# Patient Record
Sex: Female | Born: 1995 | Race: White | Hispanic: No | Marital: Single | Smoking: Never smoker
Health system: Southern US, Community
[De-identification: ages and names within clinical notes are randomized; demographics above are authoritative.]

## PROBLEM LIST (undated history)

## (undated) DIAGNOSIS — F419 Anxiety disorder, unspecified: Secondary | ICD-10-CM

## (undated) DIAGNOSIS — T7840XA Allergy, unspecified, initial encounter: Secondary | ICD-10-CM

## (undated) DIAGNOSIS — G43909 Migraine, unspecified, not intractable, without status migrainosus: Secondary | ICD-10-CM

## (undated) DIAGNOSIS — F32A Depression, unspecified: Secondary | ICD-10-CM

## (undated) HISTORY — DX: Anxiety disorder, unspecified: F41.9

## (undated) HISTORY — DX: Depression, unspecified: F32.A

## (undated) HISTORY — DX: Allergy, unspecified, initial encounter: T78.40XA

## (undated) HISTORY — PX: KIDNEY STONE SURGERY: SHX686

---
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
## 2016-01-07 DIAGNOSIS — N132 Hydronephrosis with renal and ureteral calculous obstruction: Secondary | ICD-10-CM | POA: Diagnosis not present

## 2016-01-07 DIAGNOSIS — R109 Unspecified abdominal pain: Secondary | ICD-10-CM | POA: Diagnosis not present

## 2016-01-07 DIAGNOSIS — N201 Calculus of ureter: Secondary | ICD-10-CM | POA: Diagnosis not present

## 2016-01-07 DIAGNOSIS — N200 Calculus of kidney: Secondary | ICD-10-CM | POA: Diagnosis not present

## 2016-01-08 DIAGNOSIS — N200 Calculus of kidney: Secondary | ICD-10-CM | POA: Diagnosis not present

## 2016-01-08 DIAGNOSIS — N201 Calculus of ureter: Secondary | ICD-10-CM | POA: Diagnosis not present

## 2016-01-15 DIAGNOSIS — N201 Calculus of ureter: Secondary | ICD-10-CM | POA: Diagnosis not present

## 2016-01-15 DIAGNOSIS — N200 Calculus of kidney: Secondary | ICD-10-CM | POA: Diagnosis not present

## 2016-02-25 DIAGNOSIS — N200 Calculus of kidney: Secondary | ICD-10-CM | POA: Diagnosis not present

## 2016-02-25 DIAGNOSIS — N201 Calculus of ureter: Secondary | ICD-10-CM | POA: Diagnosis not present

## 2016-03-17 DIAGNOSIS — F418 Other specified anxiety disorders: Secondary | ICD-10-CM | POA: Diagnosis not present

## 2016-03-17 DIAGNOSIS — F324 Major depressive disorder, single episode, in partial remission: Secondary | ICD-10-CM | POA: Diagnosis not present

## 2016-07-06 DIAGNOSIS — R51 Headache: Secondary | ICD-10-CM | POA: Diagnosis not present

## 2016-07-06 DIAGNOSIS — N920 Excessive and frequent menstruation with regular cycle: Secondary | ICD-10-CM | POA: Diagnosis not present

## 2016-09-05 DIAGNOSIS — Z113 Encounter for screening for infections with a predominantly sexual mode of transmission: Secondary | ICD-10-CM | POA: Diagnosis not present

## 2016-09-05 DIAGNOSIS — N921 Excessive and frequent menstruation with irregular cycle: Secondary | ICD-10-CM | POA: Diagnosis not present

## 2016-09-22 DIAGNOSIS — Z3046 Encounter for surveillance of implantable subdermal contraceptive: Secondary | ICD-10-CM | POA: Diagnosis not present

## 2016-10-15 DIAGNOSIS — R52 Pain, unspecified: Secondary | ICD-10-CM | POA: Diagnosis not present

## 2016-10-15 DIAGNOSIS — J029 Acute pharyngitis, unspecified: Secondary | ICD-10-CM | POA: Diagnosis not present

## 2016-11-29 DIAGNOSIS — N390 Urinary tract infection, site not specified: Secondary | ICD-10-CM | POA: Diagnosis not present

## 2017-01-01 DIAGNOSIS — N1330 Unspecified hydronephrosis: Secondary | ICD-10-CM | POA: Diagnosis not present

## 2017-01-01 DIAGNOSIS — R112 Nausea with vomiting, unspecified: Secondary | ICD-10-CM | POA: Diagnosis not present

## 2017-01-01 DIAGNOSIS — N132 Hydronephrosis with renal and ureteral calculous obstruction: Secondary | ICD-10-CM | POA: Diagnosis not present

## 2017-01-01 DIAGNOSIS — R1110 Vomiting, unspecified: Secondary | ICD-10-CM | POA: Diagnosis not present

## 2017-01-01 DIAGNOSIS — N201 Calculus of ureter: Secondary | ICD-10-CM | POA: Diagnosis not present

## 2017-01-01 DIAGNOSIS — Z79899 Other long term (current) drug therapy: Secondary | ICD-10-CM | POA: Diagnosis not present

## 2017-01-01 DIAGNOSIS — N136 Pyonephrosis: Secondary | ICD-10-CM | POA: Diagnosis not present

## 2017-01-01 DIAGNOSIS — R109 Unspecified abdominal pain: Secondary | ICD-10-CM | POA: Diagnosis not present

## 2017-01-01 DIAGNOSIS — K2970 Gastritis, unspecified, without bleeding: Secondary | ICD-10-CM | POA: Diagnosis not present

## 2017-01-01 DIAGNOSIS — R110 Nausea: Secondary | ICD-10-CM | POA: Diagnosis not present

## 2017-01-03 DIAGNOSIS — N200 Calculus of kidney: Secondary | ICD-10-CM | POA: Diagnosis not present

## 2017-01-03 DIAGNOSIS — N201 Calculus of ureter: Secondary | ICD-10-CM | POA: Diagnosis not present

## 2017-01-05 DIAGNOSIS — F419 Anxiety disorder, unspecified: Secondary | ICD-10-CM | POA: Diagnosis not present

## 2017-01-05 DIAGNOSIS — N132 Hydronephrosis with renal and ureteral calculous obstruction: Secondary | ICD-10-CM | POA: Diagnosis not present

## 2017-01-05 DIAGNOSIS — Z79899 Other long term (current) drug therapy: Secondary | ICD-10-CM | POA: Diagnosis not present

## 2017-01-05 DIAGNOSIS — N201 Calculus of ureter: Secondary | ICD-10-CM | POA: Diagnosis not present

## 2017-01-12 DIAGNOSIS — N201 Calculus of ureter: Secondary | ICD-10-CM | POA: Diagnosis not present

## 2017-04-17 DIAGNOSIS — Z79899 Other long term (current) drug therapy: Secondary | ICD-10-CM | POA: Diagnosis not present

## 2017-04-17 DIAGNOSIS — S161XXA Strain of muscle, fascia and tendon at neck level, initial encounter: Secondary | ICD-10-CM | POA: Diagnosis not present

## 2017-04-17 DIAGNOSIS — M542 Cervicalgia: Secondary | ICD-10-CM | POA: Diagnosis not present

## 2017-04-17 DIAGNOSIS — S060X0A Concussion without loss of consciousness, initial encounter: Secondary | ICD-10-CM | POA: Diagnosis not present

## 2017-04-17 DIAGNOSIS — R110 Nausea: Secondary | ICD-10-CM | POA: Diagnosis not present

## 2017-04-17 DIAGNOSIS — Z7951 Long term (current) use of inhaled steroids: Secondary | ICD-10-CM | POA: Diagnosis not present

## 2017-04-17 DIAGNOSIS — R51 Headache: Secondary | ICD-10-CM | POA: Diagnosis not present

## 2017-04-19 DIAGNOSIS — M25512 Pain in left shoulder: Secondary | ICD-10-CM | POA: Diagnosis not present

## 2017-04-19 DIAGNOSIS — S4992XA Unspecified injury of left shoulder and upper arm, initial encounter: Secondary | ICD-10-CM | POA: Diagnosis not present

## 2017-04-19 DIAGNOSIS — Z87820 Personal history of traumatic brain injury: Secondary | ICD-10-CM | POA: Diagnosis not present

## 2017-04-24 DIAGNOSIS — L0291 Cutaneous abscess, unspecified: Secondary | ICD-10-CM | POA: Diagnosis not present

## 2017-04-24 DIAGNOSIS — S060X9A Concussion with loss of consciousness of unspecified duration, initial encounter: Secondary | ICD-10-CM | POA: Diagnosis not present

## 2017-05-01 DIAGNOSIS — M25512 Pain in left shoulder: Secondary | ICD-10-CM | POA: Diagnosis not present

## 2017-05-04 DIAGNOSIS — S060X0A Concussion without loss of consciousness, initial encounter: Secondary | ICD-10-CM | POA: Diagnosis not present

## 2017-05-17 ENCOUNTER — Encounter: Payer: Self-pay | Admitting: Family Medicine

## 2017-05-17 ENCOUNTER — Ambulatory Visit (INDEPENDENT_AMBULATORY_CARE_PROVIDER_SITE_OTHER): Payer: BLUE CROSS/BLUE SHIELD | Admitting: Family Medicine

## 2017-05-17 VITALS — BP 137/80 | HR 80 | Ht 64.0 in | Wt 225.0 lb

## 2017-05-17 DIAGNOSIS — S060X0A Concussion without loss of consciousness, initial encounter: Secondary | ICD-10-CM

## 2017-05-17 MED ORDER — PROPRANOLOL HCL 40 MG PO TABS: 40.0000 mg | ORAL_TABLET | Freq: Three times a day (TID) | ORAL | 1 refills | Status: DC

## 2017-05-17 NOTE — Patient Instructions (Addendum)
You have a concussion. Start propranolol three times a day to help prevent headaches. You can start by taking half a tablet three times a day for a few days to make sure you tolerate this ok. Ok to take tylenol 500mg  1-2 tabs three times a day as needed for headache. We will order oculomotor therapy for you (often this is accompanied by vestibular therapy) in [place]. Take ibuprofen or aleve only if the pain from headache is severe and not helped by the tylenol. If you need a letter from me for anything give me a call. We can consider cognitive therapy, vestibular therapy, nortriptyline if not improving as expected. Follow up with me in 4 weeks but call me sooner if you have any questions.

## 2017-05-18 DIAGNOSIS — F419 Anxiety disorder, unspecified: Secondary | ICD-10-CM | POA: Insufficient documentation

## 2017-05-18 DIAGNOSIS — S060X0A Concussion without loss of consciousness, initial encounter: Secondary | ICD-10-CM | POA: Insufficient documentation

## 2017-05-18 DIAGNOSIS — Z8659 Personal history of other mental and behavioral disorders: Secondary | ICD-10-CM | POA: Insufficient documentation

## 2017-05-18 DIAGNOSIS — F329 Major depressive disorder, single episode, unspecified: Secondary | ICD-10-CM | POA: Insufficient documentation

## 2017-05-18 DIAGNOSIS — F32A Depression, unspecified: Secondary | ICD-10-CM | POA: Insufficient documentation

## 2017-05-18 NOTE — Assessment & Plan Note (Signed)
CT head negative.  Patient's first concussion.  Exam overall reassuring.  Symptoms predominantly headache, cognitive, and oculomotor.  We discussed medications and different types of therapy.  Will start propranolol.  Tylenol as needed.  Start oculomotor therapy.  F/u in 4 weeks for reevaluation.

## 2017-05-18 NOTE — Progress Notes (Signed)
PCP: System, Pcp Not In  Subjective:   HPI: Patient is a 21 y.o. female here for concussion.  Patient reports on 7/30 she was at a gas station when she slipped and struck her head on a post next to the pump. Was in shock initially but when she arrived home she developed a worsening headache, blurry vision. She went to ED and had normal head CT on 7/31. Denies loss of consciousness. This is her first concussion. Was out of work following this. She is taking fish oil, multivitamin, melatonin. Stopped her toradol given her concussion. Does not play sports. Has history of anxiety and migraines. Denies nausea, dizziness, mood changes. Most prominent symptoms are difficulty remembering, concentrating, headaches, photophobia, phonophobia, sleep problems. SCAT 3 symptoms 15/22, score 49/132.  No past medical history on file.  No current outpatient prescriptions on file prior to visit.   No current facility-administered medications on file prior to visit.     No past surgical history on file.  No Known Allergies  Social History   Social History  . Marital status: Single    Spouse name: N/A  . Number of children: N/A  . Years of education: N/A   Occupational History  . Not on file.   Social History Main Topics  . Smoking status: Never Smoker  . Smokeless tobacco: Never Used  . Alcohol use Not on file  . Drug use: Unknown  . Sexual activity: Not on file   Other Topics Concern  . Not on file   Social History Narrative  . No narrative on file    No family history on file.  BP 137/80   Pulse 80   Ht 5\' 4"  (1.626 m)   Wt 225 lb (102.1 kg)   BMI 38.62 kg/m   Review of Systems: See HPI above.     Objective:  Physical Exam:  Gen: NAD, comfortable in exam room  Neuro: CN 2-12 grossly intact. Alert, orientation 5/5 Immediate memory 14/15 Concentration 3/5 Neck FROM without pain. Balance 0 errors with single leg, double leg, tandem stance. Coordination  1/1 Delayed recall 4/5 Strength 5/5 all extremities. Sensation intact to light touch all extremities. MSRs 2+ and equal in biceps, triceps, brachioradialis, patellar, achilles tendons. Vertical and horizontal saccades could only do ~12 repetitions   Assessment & Plan:  1. Concussion without loss of consciousness - CT head negative.  Patient's first concussion.  Exam overall reassuring.  Symptoms predominantly headache, cognitive, and oculomotor.  We discussed medications and different types of therapy.  Will start propranolol.  Tylenol as needed.  Start oculomotor therapy.  F/u in 4 weeks for reevaluation.

## 2017-05-24 ENCOUNTER — Ambulatory Visit: Payer: BLUE CROSS/BLUE SHIELD | Attending: Family Medicine | Admitting: Physical Therapy

## 2017-05-24 DIAGNOSIS — R42 Dizziness and giddiness: Secondary | ICD-10-CM | POA: Diagnosis not present

## 2017-05-24 NOTE — Therapy (Signed)
[facility] [address]  [address] [place], [place], [address] Phone: [phone]   Fax:  [phone]  Physical Therapy Evaluation  Patient Details  Name: Katrina Knight MRN: [id number] Date of Birth: 17-Oct-1995 Referring Provider: Dr. Norton Blizzard  Encounter Date: 05/24/2017      PT End of Session - 05/24/17 1727    Visit Number 1   Number of Visits 6   Date for PT Re-Evaluation 07/05/17   PT Start Time 1403   PT Stop Time 1437   PT Time Calculation (min) 34 min   Activity Tolerance Patient tolerated treatment well   Behavior During Therapy [facility] for tasks assessed/performed      No past medical history on file.  No past surgical history on file.  There were no vitals filed for this visit.       Subjective Assessment - 05/24/17 1406    Subjective Was at a gas station - puddle of fuel - slipped and fell and hit head. No LOC, went to ED that night - had a headache and blurry vision. Had CT scan - clear. DX with concussion. Does continue to have headaches, as well as trouble with looking at screens and reading focusing on workd. When looking up from reading has "dots" in vision. Has a headahce currently - improves with rest, worse with loud noises and bright lights. Not driving at night.    Currently in Pain? Yes   Pain Score 4    Pain Location Head  temporal region - B   Pain Descriptors / Indicators Aching;Dull  searing pain with concentration   Pain Type Acute pain   Pain Onset More than a month ago   Pain Frequency Intermittent   Aggravating Factors  bright lights, computer screens   Pain Relieving Factors rest            [facility] PT Assessment - 05/24/17 1410      Assessment   Medical Diagnosis Concussion without LOC   Referring Provider Dr. Norton Blizzard   Onset Date/Surgical Date --  July 2018   Next MD Visit 06/14/17   Prior Therapy no     Precautions   Precautions None     Restrictions   Weight  Bearing Restrictions No     Balance Screen   Has the patient fallen in the past 6 months Yes   How many times? 1   Has the patient had a decrease in activity level because of a fear of falling?  No   Is the patient reluctant to leave their home because of a fear of falling?  No     Home Environment   Living Environment Private residence   Type of Home Apartment   Home Access Level entry   Additional Comments feels like depth perception is off     Prior Function   Level of Independence Independent   Vocation Student     Cognition   Overall Cognitive Status Within Functional Limits for tasks assessed     Observation/Other Assessments   Focus on Therapeutic Outcomes (FOTO)  Vertigo: 78 (22% limited, predicted 9% limited)     ROM / Strength   AROM / PROM / Strength AROM     AROM   Overall AROM Comments Full and symmetrical all planes - some muscular :tightness" noted with B sidebending   AROM Assessment Site Cervical            Vestibular Assessment - 05/24/17  0001      Vestibular Assessment   General Observation wearing ballcap to block lighting     Symptom Behavior   Type of Dizziness Blurred vision   Frequency of Dizziness 1-2 times a day   Duration of Dizziness 1-3 minutes   Aggravating Factors --  looking at computer screen, reading   Relieving Factors Rest     Occulomotor Exam   Occulomotor Alignment Normal   Spontaneous Absent   Gaze-induced Absent   Smooth Pursuits Intact  reports loss of focus   Saccades Slow;Poor trajectory   Comment is wearing contacts     Auditory   Comments reports ringing in B ears (alternating - typically L)        Objective measurements completed on examination: See above findings.           Vestibular Treatment/Exercise - 05/24/17 0001      Vestibular Treatment/Exercise   Vestibular Treatment Provided Gaze  Edwyna ShellHart Chart   Gaze Exercises X1 Viewing Horizontal;X1 Viewing Vertical     X1 Viewing Horizontal    Foot Position --  seated   Time --  30 sec   Reps 2     X1 Viewing Vertical   Foot Position --  seated   Time --  30 sec   Reps 2               PT Education - 05/24/17 1727    Education provided Yes   Education Details exam findings, POC, HEP   Person(s) Educated Patient   Methods Explanation;Demonstration;Handout   Comprehension Verbalized understanding;Returned demonstration             PT Long Term Goals - 05/24/17 1735      PT LONG TERM GOAL #1   Title patient to be independent with HEP   Status New   Target Date 07/05/17     PT LONG TERM GOAL #2   Title patient to report reduction in symptoms by >/= 50% for greater than 2 weeks   Status New   Target Date 07/05/17     PT LONG TERM GOAL #3   Title patient to report no difficulty looking at computer or phone screen for prolonged times   Status New   Target Date 07/05/17     PT LONG TERM GOAL #4   Title Patient to demonstrate ability to perform all smooth pursuits and saccades with no over/under shoot as well as no loss of focus.    Status New   Target Date 07/05/17                Plan - 05/24/17 1728    Clinical Impression Statement Katrina LyonsCasey is a 21 y/o female presenting to OPPT today following concussion due to fall and striking her head. Patient reporting continued heaches, difficulty with bright lights, reading, as well as difficulty with focusing. Patient is a Consulting civil engineerstudent at [employer], with current symptoms interfering with studies. Patient today with no noted nystagmus, however, does demonstrate slow and poor trajectory with saccades. Some balance deficits also noted with dififuclty with SLS as well as Romberg with EC.  Initial HEP given today for horizontal and vertical fixation exercises as well as [facility] with good carryover. Patient to benefit from PT to allow for improved symptoms for improved QOL.    Clinical Presentation Stable   Clinical Decision Making Low   Rehab Potential Good   PT  Frequency 1x / week   PT Duration 6 weeks  PT Treatment/Interventions ADLs/Self Care Home Management;Canalith Repostioning;Cryotherapy;Electrical Stimulation;Moist Heat;Neuromuscular re-education;Therapeutic exercise;Therapeutic activities;Patient/family education;Manual techniques;Passive range of motion;Visual/perceptual remediation/compensation;Vestibular;Vasopneumatic Device;Dry needling   Consulted and Agree with Plan of Care Patient      Patient will benefit from skilled therapeutic intervention in order to improve the following deficits and impairments:  Impaired vision/preception, Dizziness  Visit Diagnosis: Dizziness and giddiness - Plan: PT plan of care cert/re-cert     Problem List Patient Active Problem List   Diagnosis Date Noted  . Anxiety 05/18/2017  . Depression 05/18/2017  . History of panic attacks 05/18/2017  . Concussion without loss of consciousness 05/18/2017  . Left nephrolithiasis 01/07/2016     Kipp Laurence, PT, DPT 05/24/17 5:39 PM   [facility] [address]  [address] [place], [place], [address] Phone: [phone]   Fax:  [phone]  Name: Katrina Knight MRN: [id number] Date of Birth: Jan 29, 1996

## 2017-05-24 NOTE — Patient Instructions (Signed)
Gaze Stabilization: Sitting   Keeping eyes on target on wall \\_10N  feet away, tilt head down 15-30 and move head side to side for __30__ seconds. Repeat while moving head up and down for __30__ seconds. Do _3-5___ sessions per day.  NECK TENSION: Assisted Stretch   Reach right arm around head and hold slightly above ear. Gently bring right ear toward right shoulder. Hold position for _30__ breaths. Repeat with other arm. Repeat _3__ times, alternating arms. Do _2__ times per day.  Levator Scapula Stretch, Sitting   Sit, one hand tucked under hip on side to be stretched, other hand over top of head. Turn head toward other side and look down. Use hand on head to gently stretch neck in that position. Hold _30__ seconds. Repeat __2_ times per session.

## 2017-05-31 ENCOUNTER — Ambulatory Visit: Payer: BLUE CROSS/BLUE SHIELD | Admitting: Physical Therapy

## 2017-05-31 ENCOUNTER — Encounter: Payer: BLUE CROSS/BLUE SHIELD | Admitting: Physical Therapy

## 2017-06-07 ENCOUNTER — Ambulatory Visit: Payer: BLUE CROSS/BLUE SHIELD | Admitting: Physical Therapy

## 2017-06-07 ENCOUNTER — Encounter: Payer: BLUE CROSS/BLUE SHIELD | Admitting: Physical Therapy

## 2017-06-14 ENCOUNTER — Ambulatory Visit (INDEPENDENT_AMBULATORY_CARE_PROVIDER_SITE_OTHER): Payer: BLUE CROSS/BLUE SHIELD | Admitting: Family Medicine

## 2017-06-14 ENCOUNTER — Encounter: Payer: BLUE CROSS/BLUE SHIELD | Admitting: Physical Therapy

## 2017-06-14 ENCOUNTER — Encounter: Payer: Self-pay | Admitting: Family Medicine

## 2017-06-14 DIAGNOSIS — S060X0D Concussion without loss of consciousness, subsequent encounter: Secondary | ICD-10-CM

## 2017-06-14 NOTE — Patient Instructions (Signed)
You're doing great! Stop the propranolol now. Call me in 1-2 weeks to let me know how you're doing off of this but I don't think you'll have any issues. Go to therapy next week - this will likely be your last visit there. Continue your home exercises through to that therapy visit.

## 2017-06-16 NOTE — Assessment & Plan Note (Signed)
CT head negative, her first concussion.  Exam reassuring and clinically improving.  Has upcoming therapy appointment on 10/3 - may be her last appointment given how well she's doing.  Stop propranolol now.  Tylenol if needed.  Call us in 1-2 weeks after stopping propranolol for an update on her status.  Continue home exercises through to PT appointment.    Total visit time 25 minutes - > 50% of which spent on counseling.

## 2017-06-16 NOTE — Progress Notes (Signed)
PCP: System, Pcp Not In  Subjective:   HPI: Patient is a 21 y.o. female here for concussion.  8/29: Patient reports on 7/30 she was at a gas station when she slipped and struck her head on a post next to the pump. Was in shock initially but when she arrived home she developed a worsening headache, blurry vision. She went to ED and had normal head CT on 7/31. Denies loss of consciousness. This is her first concussion. Was out of work following this. She is taking fish oil, multivitamin, melatonin. Stopped her toradol given her concussion. Does not play sports. Has history of anxiety and migraines. Denies nausea, dizziness, mood changes. Most prominent symptoms are difficulty remembering, concentrating, headaches, photophobia, phonophobia, sleep problems. SCAT 3 symptoms 15/22, score 49/132.  9/26: Patient is doing well overall. Last headache was about a week ago. Doing well with school. Not having to take breaks. Last headache was 1 week ago. Sleeping better and concentration improved. Able to walk around campus without issues. Taking propranolol as directed. Has had one visit of therapy to date and is doing home exercises for neck and oculomotor exercises. SCAT3 6/22, symptom score 10/132.  No past medical history on file.  Current Outpatient Prescriptions on File Prior to Visit  Medication Sig Dispense Refill  . propranolol (INDERAL) 40 MG tablet Take 1 tablet (40 mg total) by mouth 3 (three) times daily. 90 tablet 1  . SPRINTEC 28 0.25-35 MG-MCG tablet Take 1 tablet by mouth daily.  3  . SUMAtriptan (IMITREX) 25 MG tablet 1 TABLET BY MOUTH 1 TO 2 TIMES A DAY AS NEEDED FOR MIGRAINE HEADACHE,MAY REPEAT DOSE ONCE IN 2 HOURS  1   No current facility-administered medications on file prior to visit.     No past surgical history on file.  No Known Allergies  Social History   Social History  . Marital status: Single    Spouse name: N/A  . Number of children: N/A  .  Years of education: N/A   Occupational History  . Not on file.   Social History Main Topics  . Smoking status: Never Smoker  . Smokeless tobacco: Never Used  . Alcohol use Not on file  . Drug use: Unknown  . Sexual activity: Not on file   Other Topics Concern  . Not on file   Social History Narrative  . No narrative on file    No family history on file.  BP 109/72   Pulse 81   Ht  (1.626 m)   Wt 225 lb (102.1 kg)   BMI 38.62 kg/m   Review of Systems: See HPI above.     Objective:  Physical Exam:  Gen: NAD, comfortable in exam room.  Neuro: Alert, orientation 5/5 Immediate memory 15/15 Concentration 5/5 Neck FROM without pain Balance 0 errors double leg, tandem stance.  1 error single leg. Coordination 1/1 Delayed recall 5/5 Vertical and horizontal saccades 30 reps without symptoms.   Assessment & Plan:  1. Concussion without loss of consciousness - CT head negative, her first concussion.  Exam reassuring and clinically improving.  Has upcoming therapy appointment on 10/3 - may be her last appointment given how well she's doing.  Stop propranolol now.  Tylenol if needed.  Call us in 1-2 weeks after stopping propranolol for an update on her status.  Continue home exercises through to PT appointment.    Total visit time 25 minutes - > 50% of which spent on counseling.

## 2017-06-21 ENCOUNTER — Encounter: Payer: BLUE CROSS/BLUE SHIELD | Admitting: Physical Therapy

## 2017-06-21 ENCOUNTER — Ambulatory Visit: Payer: BLUE CROSS/BLUE SHIELD | Attending: Family Medicine | Admitting: Physical Therapy

## 2017-06-21 DIAGNOSIS — R42 Dizziness and giddiness: Secondary | ICD-10-CM | POA: Diagnosis not present

## 2017-06-21 NOTE — Therapy (Signed)
[facility] [place] [address]  [place], [place], [address] Phone: [phone]   Fax:  [phone]  Physical Therapy Treatment  Patient Details  Name: Katrina Knight MRN: [id number] Date of Birth: [birth year] Referring Provider: Dr. Karlton Lemon  Encounter Date: 06/21/2017      PT End of Session - 06/21/17 1420    Visit Number 2   Number of Visits 6   Date for PT Re-Evaluation 07/05/17   PT Start Time 1400   PT Stop Time 1414   PT Time Calculation (min) 14 min   Activity Tolerance Patient tolerated treatment well   Behavior During Therapy [employer] for tasks assessed/performed      No past medical history on file.  No past surgical history on file.  There were no vitals filed for this visit.      Subjective Assessment - 06/21/17 1402    Subjective Good improvement in symptoms; no more falls; able to look at screen for longer   Currently in Pain? No/denies   Pain Score 0-No pain                Vestibular Assessment - 06/21/17 0001      Symptom Behavior   Type of Dizziness Blurred vision  only with prolonged screen time   Frequency of Dizziness none in past week   Duration of Dizziness none     Occulomotor Exam   Occulomotor Alignment Normal   Spontaneous Absent   Gaze-induced Absent   Smooth Pursuits Intact   Saccades Intact     Auditory   Comments ocassional ringing in ears                  Vestibular Treatment/Exercise - 06/21/17 0001      Vestibular Treatment/Exercise   Vestibular Treatment Provided Gaze   Gaze Exercises X2 Viewing Horizontal;X2 Viewing Vertical     X2 Viewing Horizontal   Foot Position --  seated   Time --  30 sec   Reps 2     X2 Viewing Vertical   Foot Position --  seated   Time --  30 sec   Reps 2                    PT Long Term Goals - 06/21/17 1420      PT LONG TERM GOAL #1   Title patient to be independent with HEP   Status  Achieved     PT LONG TERM GOAL #2   Title patient to report reduction in symptoms by >/= 50% for greater than 2 weeks   Status Achieved     PT LONG TERM GOAL #3   Title patient to report no difficulty looking at computer or phone screen for prolonged times   Status Achieved     PT LONG TERM GOAL #4   Title Patient to demonstrate ability to perform all smooth pursuits and saccades with no over/under shoot as well as no loss of focus.    Status Achieved               Plan - 06/21/17 1421    Clinical Impression Statement Adaleen reporting near full resolution in symptoms for approx 1-2 weeks. Reports MD ahs released her from his care as well as instructed patient to discontinue use of propranolol with no last side effects since discontinuing. Patient today with good convergence, saccades, and smooth pursuit with no issues with trajectory or increase  in symtpoms. Did review HEP as well as progress to X2 viewing with good carryover. Will plan to d/c today.    PT Treatment/Interventions ADLs/Self Care Home Management;Canalith Repostioning;Cryotherapy;Electrical Stimulation;Moist Heat;Neuromuscular re-education;Therapeutic exercise;Therapeutic activities;Patient/family education;Manual techniques;Passive range of motion;Visual/perceptual remediation/compensation;Vestibular;Vasopneumatic Device;Dry needling   Consulted and Agree with Plan of Care Patient      Patient will benefit from skilled therapeutic intervention in order to improve the following deficits and impairments:  Impaired vision/preception, Dizziness  Visit Diagnosis: Dizziness and giddiness     Problem List Patient Active Problem List   Diagnosis Date Noted  . Anxiety 05/18/2017  . Depression 05/18/2017  . History of panic attacks 05/18/2017  . Concussion without loss of consciousness 05/18/2017  . Left nephrolithiasis 01/07/2016   Katrina Knight, PT, DPT 06/21/17 2:42 PM   PHYSICAL THERAPY DISCHARGE  SUMMARY  Visits from Start of Care: 2  Current functional level related to goals / functional outcomes: See above   Remaining deficits: See above   Education / Equipment: HEP  Plan: Patient agrees to discharge.  Patient goals were met. Patient is being discharged due to meeting the stated rehab goals.  ?????    Katrina Knight, PT, DPT 06/21/17 2:43 PM  [facility] [address]  [place], [place], [address] Phone: [phone]   Fax:  [phone]  Name: Katrina Knight MRN: [id number] Date of Birth: 1996-03-13

## 2017-07-10 ENCOUNTER — Telehealth: Payer: Self-pay | Admitting: Family Medicine

## 2017-07-10 NOTE — Telephone Encounter (Signed)
Has she had any problems since stopping the propranolol? (i.e. Any headaches, dizziness, concentration problems).  If not we can provide a letter.  I don't think I can email it though, even through mychart.

## 2017-07-10 NOTE — Telephone Encounter (Signed)
Patient states she has not had any problems since stopping the propranolol.  She said she can pick up the letter in the office.

## 2017-07-10 NOTE — Telephone Encounter (Signed)
Patient called requesting a written statement that she has been cleared from her concussion.   Asked if it could be e-mailed?

## 2017-07-10 NOTE — Telephone Encounter (Signed)
Letter printed.

## 2017-10-28 DIAGNOSIS — N3091 Cystitis, unspecified with hematuria: Secondary | ICD-10-CM | POA: Diagnosis not present

## 2017-12-07 ENCOUNTER — Encounter: Payer: Self-pay | Admitting: Family Medicine

## 2018-03-14 DIAGNOSIS — F4323 Adjustment disorder with mixed anxiety and depressed mood: Secondary | ICD-10-CM | POA: Diagnosis not present

## 2018-03-27 DIAGNOSIS — F4323 Adjustment disorder with mixed anxiety and depressed mood: Secondary | ICD-10-CM | POA: Diagnosis not present

## 2018-08-21 DIAGNOSIS — F4323 Adjustment disorder with mixed anxiety and depressed mood: Secondary | ICD-10-CM | POA: Diagnosis not present

## 2018-08-28 DIAGNOSIS — F4323 Adjustment disorder with mixed anxiety and depressed mood: Secondary | ICD-10-CM | POA: Diagnosis not present

## 2018-09-04 DIAGNOSIS — F4323 Adjustment disorder with mixed anxiety and depressed mood: Secondary | ICD-10-CM | POA: Diagnosis not present

## 2018-09-18 DIAGNOSIS — F4323 Adjustment disorder with mixed anxiety and depressed mood: Secondary | ICD-10-CM | POA: Diagnosis not present

## 2018-09-25 DIAGNOSIS — F4323 Adjustment disorder with mixed anxiety and depressed mood: Secondary | ICD-10-CM | POA: Diagnosis not present

## 2018-10-02 DIAGNOSIS — F4323 Adjustment disorder with mixed anxiety and depressed mood: Secondary | ICD-10-CM | POA: Diagnosis not present

## 2018-10-12 DIAGNOSIS — J069 Acute upper respiratory infection, unspecified: Secondary | ICD-10-CM | POA: Diagnosis not present

## 2018-10-12 DIAGNOSIS — B9789 Other viral agents as the cause of diseases classified elsewhere: Secondary | ICD-10-CM | POA: Diagnosis not present

## 2018-10-12 DIAGNOSIS — R6889 Other general symptoms and signs: Secondary | ICD-10-CM | POA: Diagnosis not present

## 2018-10-16 DIAGNOSIS — R509 Fever, unspecified: Secondary | ICD-10-CM | POA: Diagnosis not present

## 2018-10-19 DIAGNOSIS — F4323 Adjustment disorder with mixed anxiety and depressed mood: Secondary | ICD-10-CM | POA: Diagnosis not present

## 2018-10-23 DIAGNOSIS — F4323 Adjustment disorder with mixed anxiety and depressed mood: Secondary | ICD-10-CM | POA: Diagnosis not present

## 2018-10-30 DIAGNOSIS — F4323 Adjustment disorder with mixed anxiety and depressed mood: Secondary | ICD-10-CM | POA: Diagnosis not present

## 2018-11-06 DIAGNOSIS — F4323 Adjustment disorder with mixed anxiety and depressed mood: Secondary | ICD-10-CM | POA: Diagnosis not present

## 2018-11-13 DIAGNOSIS — F4323 Adjustment disorder with mixed anxiety and depressed mood: Secondary | ICD-10-CM | POA: Diagnosis not present

## 2018-11-20 DIAGNOSIS — F4323 Adjustment disorder with mixed anxiety and depressed mood: Secondary | ICD-10-CM | POA: Diagnosis not present

## 2018-12-04 DIAGNOSIS — F4323 Adjustment disorder with mixed anxiety and depressed mood: Secondary | ICD-10-CM | POA: Diagnosis not present

## 2018-12-11 DIAGNOSIS — F4323 Adjustment disorder with mixed anxiety and depressed mood: Secondary | ICD-10-CM | POA: Diagnosis not present

## 2018-12-18 DIAGNOSIS — F4323 Adjustment disorder with mixed anxiety and depressed mood: Secondary | ICD-10-CM | POA: Diagnosis not present

## 2018-12-25 DIAGNOSIS — F4323 Adjustment disorder with mixed anxiety and depressed mood: Secondary | ICD-10-CM | POA: Diagnosis not present

## 2019-01-01 DIAGNOSIS — F4323 Adjustment disorder with mixed anxiety and depressed mood: Secondary | ICD-10-CM | POA: Diagnosis not present

## 2019-01-08 DIAGNOSIS — F4323 Adjustment disorder with mixed anxiety and depressed mood: Secondary | ICD-10-CM | POA: Diagnosis not present

## 2019-01-15 DIAGNOSIS — F4323 Adjustment disorder with mixed anxiety and depressed mood: Secondary | ICD-10-CM | POA: Diagnosis not present

## 2019-01-22 DIAGNOSIS — F4323 Adjustment disorder with mixed anxiety and depressed mood: Secondary | ICD-10-CM | POA: Diagnosis not present

## 2019-01-29 DIAGNOSIS — F4323 Adjustment disorder with mixed anxiety and depressed mood: Secondary | ICD-10-CM | POA: Diagnosis not present

## 2019-02-05 DIAGNOSIS — F4323 Adjustment disorder with mixed anxiety and depressed mood: Secondary | ICD-10-CM | POA: Diagnosis not present

## 2019-02-12 DIAGNOSIS — F4323 Adjustment disorder with mixed anxiety and depressed mood: Secondary | ICD-10-CM | POA: Diagnosis not present

## 2019-02-19 DIAGNOSIS — F4323 Adjustment disorder with mixed anxiety and depressed mood: Secondary | ICD-10-CM | POA: Diagnosis not present

## 2019-02-26 DIAGNOSIS — F4323 Adjustment disorder with mixed anxiety and depressed mood: Secondary | ICD-10-CM | POA: Diagnosis not present

## 2019-03-05 DIAGNOSIS — F4323 Adjustment disorder with mixed anxiety and depressed mood: Secondary | ICD-10-CM | POA: Diagnosis not present

## 2019-03-19 DIAGNOSIS — F4323 Adjustment disorder with mixed anxiety and depressed mood: Secondary | ICD-10-CM | POA: Diagnosis not present

## 2019-03-26 DIAGNOSIS — F4323 Adjustment disorder with mixed anxiety and depressed mood: Secondary | ICD-10-CM | POA: Diagnosis not present

## 2019-06-06 DIAGNOSIS — R070 Pain in throat: Secondary | ICD-10-CM | POA: Diagnosis not present

## 2019-06-06 DIAGNOSIS — H6692 Otitis media, unspecified, left ear: Secondary | ICD-10-CM | POA: Diagnosis not present

## 2019-06-06 DIAGNOSIS — Z20828 Contact with and (suspected) exposure to other viral communicable diseases: Secondary | ICD-10-CM | POA: Diagnosis not present

## 2019-06-26 DIAGNOSIS — Z118 Encounter for screening for other infectious and parasitic diseases: Secondary | ICD-10-CM | POA: Diagnosis not present

## 2019-06-26 DIAGNOSIS — Z01419 Encounter for gynecological examination (general) (routine) without abnormal findings: Secondary | ICD-10-CM | POA: Diagnosis not present

## 2019-06-26 DIAGNOSIS — Z3009 Encounter for other general counseling and advice on contraception: Secondary | ICD-10-CM | POA: Diagnosis not present

## 2019-06-26 DIAGNOSIS — Z6841 Body Mass Index (BMI) 40.0 and over, adult: Secondary | ICD-10-CM | POA: Diagnosis not present

## 2019-07-15 DIAGNOSIS — R519 Headache, unspecified: Secondary | ICD-10-CM | POA: Diagnosis not present

## 2019-07-15 DIAGNOSIS — G43819 Other migraine, intractable, without status migrainosus: Secondary | ICD-10-CM | POA: Diagnosis not present

## 2019-09-23 DIAGNOSIS — R0981 Nasal congestion: Secondary | ICD-10-CM | POA: Diagnosis not present

## 2019-09-23 DIAGNOSIS — R5383 Other fatigue: Secondary | ICD-10-CM | POA: Diagnosis not present

## 2019-09-23 DIAGNOSIS — Z20828 Contact with and (suspected) exposure to other viral communicable diseases: Secondary | ICD-10-CM | POA: Diagnosis not present

## 2019-10-02 DIAGNOSIS — J019 Acute sinusitis, unspecified: Secondary | ICD-10-CM | POA: Diagnosis not present

## 2019-10-02 DIAGNOSIS — J029 Acute pharyngitis, unspecified: Secondary | ICD-10-CM | POA: Diagnosis not present

## 2019-10-14 DIAGNOSIS — Z30011 Encounter for initial prescription of contraceptive pills: Secondary | ICD-10-CM | POA: Diagnosis not present

## 2020-05-15 DIAGNOSIS — F4323 Adjustment disorder with mixed anxiety and depressed mood: Secondary | ICD-10-CM | POA: Diagnosis not present

## 2020-06-19 ENCOUNTER — Ambulatory Visit: Payer: BLUE CROSS/BLUE SHIELD | Admitting: Nurse Practitioner

## 2020-06-24 ENCOUNTER — Ambulatory Visit: Payer: BLUE CROSS/BLUE SHIELD | Admitting: Nurse Practitioner

## 2020-07-16 ENCOUNTER — Other Ambulatory Visit: Payer: Self-pay

## 2020-07-17 ENCOUNTER — Ambulatory Visit: Payer: BC Managed Care – PPO | Admitting: Nurse Practitioner

## 2020-07-17 ENCOUNTER — Encounter: Payer: Self-pay | Admitting: Nurse Practitioner

## 2020-07-17 VITALS — BP 126/84 | HR 82 | Temp 97.0°F | Wt 244.0 lb

## 2020-07-17 DIAGNOSIS — G43719 Chronic migraine without aura, intractable, without status migrainosus: Secondary | ICD-10-CM

## 2020-07-17 DIAGNOSIS — Z23 Encounter for immunization: Secondary | ICD-10-CM

## 2020-07-17 MED ORDER — TOPIRAMATE 25 MG PO TABS: ORAL_TABLET | ORAL | 5 refills | Status: DC

## 2020-07-17 NOTE — Patient Instructions (Addendum)
Topamax effects the effectiveness of your contraception. Use condoms in combination with oral hormonal contraception to prevent pregnancy. Decrease caffeine intake  Recurrent Migraine Headache  Migraines are a type of headache, and they are usually stronger and more sudden than normal headaches (tension headaches). Migraines are characterized by an intense pulsing, throbbing pain that is usually only present on one side of the head. Sometimes, migraine headaches can cause nausea, vomiting, sensitivity to light and sound, and vision changes. Recurrent migraines keep coming back (recurring). A migraine can last from 4 hours up to 3 days. What are the causes? The exact cause of this condition is not known. However, a migraine may be caused when nerves in the brain become irritated and release chemicals that cause inflammation of blood vessels. This inflammation causes pain. Certain things may also trigger migraines, such as:  A disruption in your regular eating and sleeping schedule.  Smoking.  Stress.  Menstruation.  Certain foods and drinks, such as: ? Aged cheese. ? Chocolate. ? Alcohol. ? Caffeine. ? Foods or drinks that contain nitrates, glutamate, aspartame, MSG, or tyramine.  Lack of sleep.  Hunger.  Physical exertion.  Fatigue.  High altitude.  Weather changes.  Medicines, such as: ? Nitroglycerin, which is used to treat chest pain. ? Birth control pills. ? Estrogen. ? Some blood pressure medicines. What are the signs or symptoms? Symptoms of this condition vary for each person and may include:  Pain that is usually only present on one side of the head. In some cases, the pain may be on both sides of the head or around the head or neck.  Pulsating or throbbing pain.  Severe pain that prevents daily activities.  Pain that is aggravated by any physical activity.  Nausea, vomiting, or both.  Dizziness.  Pain with exposure to bright lights, loud noises, or  activity.  General sensitivity to bright lights, loud noises, or smells. Before you get a migraine, you may get warning signs that a migraine is coming (aura). An aura may include:  Seeing flashing lights.  Seeing bright spots, halos, or zigzag lines.  Having tunnel vision or blurred vision.  Having numbness or a tingling feeling.  Having trouble talking.  Having muscle weakness.  Smelling a certain odor. How is this diagnosed? This condition is often diagnosed based on:  Your symptoms and medical history.  A physical exam. You may also have tests, including:  A CT scan or MRI of your brain. These imaging tests cannot diagnose migraines, but they can help to rule out other causes of headaches.  Blood tests. How is this treated? This condition is treated with:  Medicines. These are used for: ? Lessening pain and nausea. ? Preventing recurrent migraines.  Lifestyle changes, such as changes to your diet or sleeping patterns.  Behavior therapy, such as relaxation training or biofeedback. Biofeedback is a treatment that involves teaching you to relax and use your brain to lower your heart rate and control your breathing. Follow these instructions at home: Medicines  Take over-the-counter and prescription medicines only as told by your health care provider.  Do not drive or use heavy machinery while taking prescription pain medicine. Lifestyle  Do not use any products that contain nicotine or tobacco, such as cigarettes and e-cigarettes. If you need help quitting, ask your health care provider.  Limit alcohol intake to no more than 1 drink a day for nonpregnant women and 2 drinks a day for men. One drink equals 12 oz of beer,  5 oz of wine, or 1 oz of hard liquor.  Get 7-9 hours of sleep each night, or the amount of sleep recommended by your health care provider.  Limit your stress. Talk with your health care provider if you need help with stress management.  Maintain  a healthy weight. If you need help losing weight, ask your health care provider.  Exercise regularly. Aim for 150 minutes of moderate-intensity exercise (walking, biking, yoga) or 75 minutes of vigorous exercise (running, circuit training, swimming) each week. General instructions   Keep a journal to find out what triggers your migraine headaches so you can avoid these triggers. For example, write down: ? What you eat and drink. ? How much sleep you get. ? Any change to your diet or medicines.  Lie down in a dark, quiet room when you have a migraine.  Try placing a cool towel over your head when you have a migraine.  Keep lights dim, if bright lights bother you and make your migraines worse.  Keep all follow-up visits as told by your health care provider. This is important. Contact a health care provider if:  Your pain does not improve, even with medicine.  Your migraines continue to return, even with medicine.  You have a fever.  You have weight loss. Get help right away if:  Your migraine becomes severe and medicine does not help.  You have a stiff neck.  You have a loss of vision.  You have muscle weakness or loss of muscle control.  You start losing your balance or have trouble walking.  You feel faint or you pass out.  You develop new, severe symptoms.  You start having abrupt severe headaches that last for a second or less, like a thunderclap. Summary  Migraine headaches are usually stronger and more sudden than normal headaches (tension headaches). Migraines are characterized by an intense pulsing, throbbing pain that is usually only present on one side of the head.  The exact cause of this condition is not known. However, a migraine may be caused when nerves in the brain become irritated and release chemicals that cause inflammation of blood vessels.  Certain things may trigger migraines, such as changes to diet or sleeping patterns, smoking, certain foods,  alcohol, stress, and certain medicines.  Sometimes, migraine headaches can cause nausea, vomiting, sensitivity to light and sound, and vision changes.  Migraines are often diagnosed based on your symptoms, medical history, and a physical exam. This information is not intended to replace advice given to you by your health care provider. Make sure you discuss any questions you have with your health care provider. Document Revised: 09/08/2017 Document Reviewed: 06/17/2016 Elsevier Patient Education  2020 [employer].

## 2020-07-17 NOTE — Progress Notes (Signed)
Subjective:  Patient ID: Katrina Knight, female    DOB: [birth year]  Age: 24 y.o. MRN: [id number]  CC: Establish Care (New patient/Migraines for several years but more frequently in the past 2 years. )  Migraine  This is a chronic problem. The current episode started more than 1 year ago (age 4). The problem occurs constantly. The problem has been gradually worsening. The pain is located in the frontal and retro-orbital region. The pain does not radiate. The pain quality is similar to prior headaches. The quality of the pain is described as dull and squeezing. The pain is moderate. Associated symptoms include eye pain, photophobia and sinus pressure. Pertinent negatives include no abdominal pain, abnormal behavior, blurred vision, dizziness, eye redness, eye watering, fever, hearing loss, insomnia, loss of balance, muscle aches, nausea, numbness, phonophobia, rhinorrhea, tingling, tinnitus or visual change. The symptoms are aggravated by emotional stress, bright light, caffeine withdrawal and weather changes. She has tried triptans and Excedrin for the symptoms. The treatment provided moderate relief. Her past medical history is significant for migraine headaches, migraines in the family and obesity. There is no history of cancer, cluster headaches, hypertension, immunosuppression, pseudotumor cerebri, recent head traumas, sinus disease or TMJ.  concussion in 2018, hit head on metal pole at a gas station. CT Head done 2018 post head trauma (hit head on metal pole): normal Wakes up with pressure in frontal portion everyday. Use of blue light glass, use of Excedrin 2x/month, unable to tolerate imitrex ("makes me feel funny but helped with headache").  Reviewed past Medical, Social and Family history today.  Outpatient Medications Prior to Visit  Medication Sig Dispense Refill  . AUROVELA FE 1.5/30 1.5-30 MG-MCG tablet Take 1 tablet by mouth daily.    . propranolol (INDERAL) 40 MG tablet Take 1 tablet  (40 mg total) by mouth 3 (three) times daily. 90 tablet 1  . SPRINTEC 28 0.25-35 MG-MCG tablet Take 1 tablet by mouth daily.  3  . SUMAtriptan (IMITREX) 25 MG tablet 1 TABLET BY MOUTH 1 TO 2 TIMES A DAY AS NEEDED FOR MIGRAINE HEADACHE,MAY REPEAT DOSE ONCE IN 2 HOURS  1   No facility-administered medications prior to visit.    ROS See HPI  Objective:  BP 126/84 (BP Location: Left Arm, Patient Position: Sitting, Cuff Size: Large)   Pulse 82   Temp (!) 97 F (36.1 C) (Temporal)   Wt 244 lb (110.7 kg)   SpO2 98%   BMI 41.88 kg/m   Physical Exam Vitals reviewed.  Constitutional:      Appearance: She is obese.  Cardiovascular:     Rate and Rhythm: Normal rate and regular rhythm.     Pulses: Normal pulses.     Heart sounds: Normal heart sounds.  Pulmonary:     Effort: Pulmonary effort is normal.     Breath sounds: Normal breath sounds.  Musculoskeletal:     Cervical back: Normal range of motion and neck supple.  Lymphadenopathy:     Cervical: No cervical adenopathy.  Neurological:     Mental Status: She is alert and oriented to person, place, and time.  Psychiatric:        Mood and Affect: Mood normal.        Behavior: Behavior normal.        Thought Content: Thought content normal.    Assessment & Plan:  This visit occurred during the SARS-CoV-2 public health emergency.  Safety protocols were in place, including screening questions prior to the visit,  additional usage of staff PPE, and extensive cleaning of exam room while observing appropriate contact time as indicated for disinfecting solutions.   Katrina Knight was seen today for establish care.  Diagnoses and all orders for this visit:  Intractable chronic migraine without aura and without status migrainosus -     topiramate (TOPAMAX) 25 MG tablet; Take 1tab daily x 2week, then 1tab BID continuously  Influenza vaccine needed -     Flu Vaccine QUAD 6+ mos PF IM (Fluarix Quad PF)   Problem List Items Addressed This Visit        Cardiovascular and Mediastinum   Intractable chronic migraine without aura and without status migrainosus - Primary    Chronic, onset at age 47, worsening inn last 66yrs.  Triggers: stress, changes in weather, bright light. Consumes 20oz of coffee daily tobacco, occasional ETOH intake. No hx of seizure. FHx of migraine (mother) Current use of OCP, not sexually active. concussion in 2018, hit head on metal pole at a gas station. CT Head done 2018 post head trauma (hit head on metal pole): normal Wakes up with pressure in frontal portion everyday. Has migraine headache 2-3x/month (some improvement with Excedrin and bluelight glasses), unable to tolerate imitrex ("makes me feel funny but helped with headache"). She does not remember taking propanolol that was prescribed in 2018.  Start topamax: titrate up to 25mg  BID. F/up in 5month. Advised about possible side effects: Topamax effects the effectiveness of your contraception. Use condoms in combination with oral hormonal contraception to prevent pregnancy Also advised to Decrease caffeine intake.      Relevant Medications   topiramate (TOPAMAX) 25 MG tablet    Other Visit Diagnoses    Influenza vaccine needed       Relevant Orders   Flu Vaccine QUAD 6+ mos PF IM (Fluarix Quad PF) (Completed)      Follow-up: Return in about 4 weeks (around 08/14/2020) for CPE (fasting).  08/16/2020, NP

## 2020-07-17 NOTE — Assessment & Plan Note (Signed)
Chronic, onset at age 24, worsening inn last 65yrs.  Triggers: stress, changes in weather, bright light. Consumes 20oz of coffee daily tobacco, occasional ETOH intake. No hx of seizure. FHx of migraine (mother) Current use of OCP, not sexually active. concussion in 2018, hit head on metal pole at a gas station. CT Head done 2018 post head trauma (hit head on metal pole): normal Wakes up with pressure in frontal portion everyday. Has migraine headache 2-3x/month (some improvement with Excedrin and bluelight glasses), unable to tolerate imitrex ("makes me feel funny but helped with headache"). She does not remember taking propanolol that was prescribed in 2018.  Start topamax: titrate up to 25mg  BID. F/up in 37month. Advised about possible side effects: Topamax effects the effectiveness of your contraception. Use condoms in combination with oral hormonal contraception to prevent pregnancy Also advised to Decrease caffeine intake.

## 2020-08-08 DIAGNOSIS — Z1152 Encounter for screening for COVID-19: Secondary | ICD-10-CM | POA: Diagnosis not present

## 2020-08-28 ENCOUNTER — Encounter: Payer: BC Managed Care – PPO | Admitting: Nurse Practitioner

## 2020-09-04 DIAGNOSIS — Z20822 Contact with and (suspected) exposure to covid-19: Secondary | ICD-10-CM | POA: Diagnosis not present

## 2020-10-01 ENCOUNTER — Other Ambulatory Visit: Payer: Self-pay

## 2020-10-02 ENCOUNTER — Encounter: Payer: Self-pay | Admitting: Nurse Practitioner

## 2020-10-02 ENCOUNTER — Telehealth (INDEPENDENT_AMBULATORY_CARE_PROVIDER_SITE_OTHER): Payer: BC Managed Care – PPO | Admitting: Nurse Practitioner

## 2020-10-02 VITALS — Ht 64.0 in | Wt 244.0 lb

## 2020-10-02 DIAGNOSIS — J209 Acute bronchitis, unspecified: Secondary | ICD-10-CM

## 2020-10-02 MED ORDER — AMOXICILLIN-POT CLAVULANATE 875-125 MG PO TABS: 1.0000 | ORAL_TABLET | Freq: Two times a day (BID) | ORAL | 0 refills | Status: DC

## 2020-10-02 MED ORDER — GUAIFENESIN-DM 100-10 MG/5ML PO SYRP: 5.0000 mL | ORAL_SOLUTION | ORAL | 0 refills | Status: DC | PRN

## 2020-10-02 MED ORDER — CETIRIZINE HCL 10 MG PO TABS: 10.0000 mg | ORAL_TABLET | Freq: Every day | ORAL | 0 refills | Status: DC

## 2020-10-02 NOTE — Progress Notes (Signed)
Virtual Visit via Video Note  I connected with@ on 10/02/20 at 10:15 AM EST by a video enabled telemedicine application and verified that I am speaking with the correct person using two identifiers.  Location: Patient:Home Provider: Office Participants: patient and provider  I discussed the limitations of evaluation and management by telemedicine and the availability of in person appointments. I also discussed with the patient that there may be a patient responsible charge related to this service. The patient expressed understanding and agreed to proceed.  CC:Pt c/o cough and states there feels like something needs to come up with the cough but it doesn't x 10 or more days.   History of Present Illness: Cough This is a new problem. The current episode started 1 to 4 weeks ago. The problem has been unchanged. The problem occurs constantly. The cough is non-productive. Associated symptoms include nasal congestion and postnasal drip. Pertinent negatives include no chest pain, chills, fever, rhinorrhea, sore throat, shortness of breath, sweats, weight loss or wheezing. The symptoms are aggravated by lying down. She has tried nothing for the symptoms. There is no history of asthma, bronchitis, COPD or environmental allergies.   Observations/Objective: Physical Exam Pulmonary:     Effort: Pulmonary effort is normal.  Neurological:     Mental Status: She is alert and oriented to person, place, and time.    Assessment and Plan: Katrina Knight was seen today for acute visit.  Diagnoses and all orders for this visit:  Acute bronchitis, unspecified organism -     amoxicillin-clavulanate (AUGMENTIN) 875-125 MG tablet; Take 1 tablet by mouth 2 (two) times daily. -     guaiFENesin-dextromethorphan (ROBITUSSIN DM) 100-10 MG/5ML syrup; Take 5 mLs by mouth every 4 (four) hours as needed for cough. -     cetirizine (ZYRTEC) 10 MG tablet; Take 1 tablet (10 mg total) by mouth daily.   Follow Up  Instructions: See above   I discussed the assessment and treatment plan with the patient. The patient was provided an opportunity to ask questions and all were answered. The patient agreed with the plan and demonstrated an understanding of the instructions.   The patient was advised to call back or seek an in-person evaluation if the symptoms worsen or if the condition fails to improve as anticipated.   Alysia Penna, NP

## 2020-10-16 ENCOUNTER — Telehealth: Payer: Self-pay | Admitting: Nurse Practitioner

## 2020-10-16 NOTE — Telephone Encounter (Signed)
Patient sent MyChart message requesting to schedule Annual Physical. Called and left message to give the office a call back.

## 2020-11-28 DIAGNOSIS — H8110 Benign paroxysmal vertigo, unspecified ear: Secondary | ICD-10-CM | POA: Diagnosis not present

## 2020-11-28 DIAGNOSIS — H6500 Acute serous otitis media, unspecified ear: Secondary | ICD-10-CM | POA: Diagnosis not present

## 2020-11-28 DIAGNOSIS — R1110 Vomiting, unspecified: Secondary | ICD-10-CM | POA: Diagnosis not present

## 2020-12-09 DIAGNOSIS — Z01419 Encounter for gynecological examination (general) (routine) without abnormal findings: Secondary | ICD-10-CM | POA: Diagnosis not present

## 2020-12-09 DIAGNOSIS — Z6839 Body mass index (BMI) 39.0-39.9, adult: Secondary | ICD-10-CM | POA: Diagnosis not present

## 2020-12-09 DIAGNOSIS — Z118 Encounter for screening for other infectious and parasitic diseases: Secondary | ICD-10-CM | POA: Diagnosis not present

## 2021-01-13 ENCOUNTER — Other Ambulatory Visit: Payer: Self-pay

## 2021-01-14 ENCOUNTER — Ambulatory Visit (INDEPENDENT_AMBULATORY_CARE_PROVIDER_SITE_OTHER): Payer: BC Managed Care – PPO | Admitting: Nurse Practitioner

## 2021-01-14 ENCOUNTER — Encounter: Payer: Self-pay | Admitting: Nurse Practitioner

## 2021-01-14 VITALS — BP 124/74 | HR 88 | Temp 97.5°F | Ht 64.0 in | Wt 233.2 lb

## 2021-01-14 DIAGNOSIS — Z13220 Encounter for screening for lipoid disorders: Secondary | ICD-10-CM | POA: Diagnosis not present

## 2021-01-14 DIAGNOSIS — Z0000 Encounter for general adult medical examination without abnormal findings: Secondary | ICD-10-CM | POA: Diagnosis not present

## 2021-01-14 DIAGNOSIS — Z136 Encounter for screening for cardiovascular disorders: Secondary | ICD-10-CM

## 2021-01-14 DIAGNOSIS — G43719 Chronic migraine without aura, intractable, without status migrainosus: Secondary | ICD-10-CM | POA: Diagnosis not present

## 2021-01-14 MED ORDER — TOPIRAMATE 25 MG PO TABS: 25.0000 mg | ORAL_TABLET | Freq: Two times a day (BID) | ORAL | 3 refills | Status: DC

## 2021-01-14 NOTE — Progress Notes (Signed)
Subjective:    Patient ID: Katrina Knight, female    DOB: September 21, 1995, 25 y.o.   MRN: [id number]  Patient presents today for CPE   HPI Intractable chronic migraine without aura and without status migrainosus Improved with use of topamax BID and decrease caffeine intake. No need for abortive therapy Use of COC and condoms for contraception Med refill sent  Sexual History (orientation,birth control, marital status, STD):use of COC, no need for STD screen Will schedule appt with GYN for repeat breast and pelvic exam  Depression/Suicide: will schedule appt with therapist Depression screen [facility] 2/9 01/14/2021  Decreased Interest 0  Down, Depressed, Hopeless 0  PHQ - 2 Score 0  Altered sleeping 1  Tired, decreased energy 1  Change in appetite 0  Feeling bad or failure about yourself  0  Trouble concentrating 1  Moving slowly or fidgety/restless 0  Suicidal thoughts 0  PHQ-9 Score 3  Difficult doing work/chores Not difficult at all   GAD 7 : Generalized Anxiety Score 01/14/2021  Nervous, Anxious, on Edge 1  Control/stop worrying 1  Worry too much - different things 0  Trouble relaxing 0  Restless 0  Easily annoyed or irritable 1  Afraid - awful might happen 0  Total GAD 7 Score 3  Anxiety Difficulty Somewhat difficult   Vision:up to date, use of contact lens  Dental:will schedule  Immunizations: (TDAP, Hep C screen, Pneumovax, Influenza, zoster)  Health Maintenance  Topic Date Due  . Pap Smear  Never done  . Pap Smear  Never done  .  Hepatitis C: One time screening is recommended by Center for Disease Control  (CDC) for  adults born from [birth year] through 1964.   01/14/2022*  . HIV Screening  01/14/2022*  . Flu Shot  04/19/2021  . Tetanus Vaccine  02/23/2028  . HPV Vaccine  Completed  . COVID-19 Vaccine  Completed  *Topic was postponed. The date shown is not the original due date.   Diet:regular Exercise: none Weight:  Wt Readings from Last 3 Encounters:  01/14/21 233  lb 3.2 oz (105.8 kg)  10/02/20 244 lb (110.7 kg)  07/17/20 244 lb (110.7 kg)   Fall Risk: Fall Risk  01/14/2021 10/02/2020  Falls in the past year? 0 0  Number falls in past yr: 0 0  Injury with Fall? 0 0  Risk for fall due to : No Fall Risks No Fall Risks  Follow up Falls evaluation completed Falls evaluation completed    Medications and allergies reviewed with patient and updated if appropriate.  Patient Active Problem List   Diagnosis Date Noted  . Intractable chronic migraine without aura and without status migrainosus 07/17/2020  . Anxiety 05/18/2017  . Depression 05/18/2017  . History of panic attacks 05/18/2017  . Concussion without loss of consciousness 05/18/2017  . Left nephrolithiasis 01/07/2016    Current Outpatient Medications on File Prior to Visit  Medication Sig Dispense Refill  . meclizine (ANTIVERT) 25 MG tablet Take 25 mg by mouth 3 (three) times daily as needed for dizziness.    . ondansetron (ZOFRAN) 8 MG tablet Take by mouth every 8 (eight) hours as needed for nausea or vomiting.    Rosalia Hammers FE 1.5/30 1.5-30 MG-MCG tablet Take 1 tablet by mouth daily.    . cetirizine (ZYRTEC) 10 MG tablet Take 1 tablet (10 mg total) by mouth daily. 7 tablet 0   No current facility-administered medications on file prior to visit.    Past Medical History:  Diagnosis Date  . Anxiety   . Depression     Past Surgical History:  Procedure Laterality Date  . KIDNEY STONE SURGERY     2017 and 2018    Social History   Socioeconomic History  . Marital status: Single    Spouse name: Not on file  . Number of children: 0  . Years of education: Not on file  . Highest education level: Not on file  Occupational History  . Not on file  Tobacco Use  . Smoking status: Never Smoker  . Smokeless tobacco: Never Used  Vaping Use  . Vaping Use: Never used  Substance and Sexual Activity  . Alcohol use: Yes    Comment: Occasionally  . Drug use: Never  . Sexual activity: Yes     Birth control/protection: Pill  Other Topics Concern  . Not on file  Social History Narrative  . Not on file   Social Determinants of Health   Financial Resource Strain: Not on file  Food Insecurity: Not on file  Transportation Needs: Not on file  Physical Activity: Not on file  Stress: Not on file  Social Connections: Not on file    Family History  Problem Relation Age of Onset  . Cancer Mother 9       Breast cancer, negative BRCA gene  . Hypertension Mother   . Migraines Mother   . Sleep apnea Mother         Review of Systems  Constitutional: Negative for fever, malaise/fatigue and weight loss.  HENT: Negative for congestion and sore throat.   Eyes:       Negative for visual changes  Respiratory: Negative for cough and shortness of breath.   Cardiovascular: Negative for chest pain, palpitations and leg swelling.  Gastrointestinal: Negative for blood in stool, constipation, diarrhea and heartburn.  Genitourinary: Negative for dysuria, frequency and urgency.  Musculoskeletal: Negative for falls, joint pain and myalgias.  Skin: Negative for rash.  Neurological: Negative for dizziness, sensory change and headaches.  Endo/Heme/Allergies: Does not bruise/bleed easily.  Psychiatric/Behavioral: Negative for depression, hallucinations, substance abuse and suicidal ideas. The patient is nervous/anxious. The patient does not have insomnia.     Objective:   Vitals:   01/14/21 1002  BP: 124/74  Pulse: 88  Temp: (!) 97.5 F (36.4 C)  SpO2: 98%    Body mass index is 40.03 kg/m.   Physical Examination:  Physical Exam Vitals and nursing note reviewed.  Constitutional:      General: She is not in acute distress.    Appearance: She is well-developed. She is obese.  HENT:     Right Ear: Ear canal and external ear normal. No drainage or tenderness. A middle ear effusion is present. There is no impacted cerumen. Tympanic membrane is not injected, scarred, perforated  or erythematous.     Left Ear: Ear canal and external ear normal. No drainage. A middle ear effusion is present. There is no impacted cerumen. Tympanic membrane is not injected, scarred, perforated or erythematous.     Mouth/Throat:     Pharynx: No oropharyngeal exudate.  Eyes:     Conjunctiva/sclera: Conjunctivae normal.     Pupils: Pupils are equal, round, and reactive to light.  Cardiovascular:     Rate and Rhythm: Normal rate and regular rhythm.     Pulses: Normal pulses.     Heart sounds: Normal heart sounds.  Pulmonary:     Effort: Pulmonary effort is normal. No respiratory distress.  Breath sounds: Normal breath sounds.  Chest:     Chest wall: No tenderness.  Abdominal:     General: Bowel sounds are normal.     Palpations: Abdomen is soft.  Genitourinary:    Comments: Deferred breast and pelvic exam to GYN Musculoskeletal:        General: Normal range of motion.     Cervical back: Normal range of motion and neck supple.     Right lower leg: No edema.     Left lower leg: No edema.  Skin:    General: Skin is warm and dry.     Findings: Lesion present.       Neurological:     Mental Status: She is alert and oriented to person, place, and time.     Deep Tendon Reflexes: Reflexes are normal and symmetric.  Psychiatric:        Mood and Affect: Mood normal.        Behavior: Behavior normal.        Thought Content: Thought content normal.    ASSESSMENT and PLAN: This visit occurred during the SARS-CoV-2 public health emergency.  Safety protocols were in place, including screening questions prior to the visit, additional usage of staff PPE, and extensive cleaning of exam room while observing appropriate contact time as indicated for disinfecting solutions.   Neelam was seen today for annual exam.  Diagnoses and all orders for this visit:  Preventative health care -     CBC; Future -     Comprehensive metabolic panel -     Lipid panel; Future -     TSH;  Future  Intractable chronic migraine without aura and without status migrainosus -     topiramate (TOPAMAX) 25 MG tablet; Take 1 tablet (25 mg total) by mouth 2 (two) times daily.  Encounter for lipid screening for cardiovascular disease -     Lipid panel; Future      Problem List Items Addressed This Visit      Cardiovascular and Mediastinum   Intractable chronic migraine without aura and without status migrainosus    Improved with use of topamax BID and decrease caffeine intake. No need for abortive therapy Use of COC and condoms for contraception Med refill sent      Relevant Medications   topiramate (TOPAMAX) 25 MG tablet    Other Visit Diagnoses    Preventative health care    -  Primary   Relevant Orders   CBC   Comprehensive metabolic panel   Lipid panel   TSH   Encounter for lipid screening for cardiovascular disease       Relevant Orders   Lipid panel      Follow up: Return in about 1 year (around 01/14/2022) for CPE (fasting).  Wilfred Lacy, NP

## 2021-01-14 NOTE — Assessment & Plan Note (Addendum)
Improved with use of topamax BID and decrease caffeine intake. No need for abortive therapy Use of COC and condoms for contraception Med refill sent

## 2021-01-14 NOTE — Patient Instructions (Addendum)
Schedule fasting lab appt. You will need to be fasting at least 6-8hrs prior to blood draw Ok to drink water.  Maintain heart healthy diet and daily exercise Schedule appt with dentist every24month Sign medical release form to get records from [id number] Old, Female Preventive care refers to lifestyle choices and visits with your health care provider that can promote health and wellness. This includes:  A yearly physical exam. This is also called an annual wellness visit.  Regular dental and eye exams.  Immunizations.  Screening for certain conditions.  Healthy lifestyle choices, such as: ? Eating a healthy diet. ? Getting regular exercise. ? Not using drugs or products that contain nicotine and tobacco. ? Limiting alcohol use. What can I expect for my preventive care visit? Physical exam Your health care provider may check your:  Height and weight. These may be used to calculate your BMI (body mass index). BMI is a measurement that tells if you are at a healthy weight.  Heart rate and blood pressure.  Body temperature.  Skin for abnormal spots. Counseling Your health care provider may ask you questions about your:  Past medical problems.  Family's medical history.  Alcohol, tobacco, and drug use.  Emotional well-being.  Home life and relationship well-being.  Sexual activity.  Diet, exercise, and sleep habits.  Work and work eStatistician  Access to firearms.  Method of birth control.  Menstrual cycle.  Pregnancy history. What immunizations do I need? Vaccines are usually given at various ages, according to a schedule. Your health care provider will recommend vaccines for you based on your age, medical history, and lifestyle or other factors, such as travel or where you work.   What tests do I need? Blood tests  Lipid and cholesterol levels. These may be checked every 5 years starting at age 24  Hepatitis C test.  Hepatitis B  test. Screening  Diabetes screening. This is done by checking your blood sugar (glucose) after you have not eaten for a while (fasting).  STD (sexually transmitted disease) testing, if you are at risk.  BRCA-related cancer screening. This may be done if you have a family history of breast, ovarian, tubal, or peritoneal cancers.  Pelvic exam and Pap test. This may be done every 3 years starting at age 24 Starting at age 24 this may be done every 5 years if you have a Pap test in combination with an HPV test. Talk with your health care provider about your test results, treatment options, and if necessary, the need for more tests.   Follow these instructions at home: Eating and drinking  Eat a healthy diet that includes fresh fruits and vegetables, whole grains, lean protein, and low-fat dairy products.  Take vitamin and mineral supplements as recommended by your health care provider.  Do not drink alcohol if: ? Your health care provider tells you not to drink. ? You are pregnant, may be pregnant, or are planning to become pregnant.  If you drink alcohol: ? Limit how much you have to 0-1 drink a day. ? Be aware of how much alcohol is in your drink. In the [place] one drink equals one 12 oz bottle of beer (355 mL), one 5 oz glass of wine (148 mL), or one 1 oz glass of hard liquor (44 mL).   Lifestyle  Take daily care of your teeth and gums. Brush your teeth every morning and night with fluoride toothpaste. Floss one time each day.  Stay  active. Exercise for at least 30 minutes 5 or more days each week.  Do not use any products that contain nicotine or tobacco, such as cigarettes, e-cigarettes, and chewing tobacco. If you need help quitting, ask your health care provider.  Do not use drugs.  If you are sexually active, practice safe sex. Use a condom or other form of protection to prevent STIs (sexually transmitted infections).  If you do not wish to become pregnant, use a form of  birth control. If you plan to become pregnant, see your health care provider for a prepregnancy visit.  Find healthy ways to cope with stress, such as: ? Meditation, yoga, or listening to music. ? Journaling. ? Talking to a trusted person. ? Spending time with friends and family. Safety  Always wear your seat belt while driving or riding in a vehicle.  Do not drive: ? If you have been drinking alcohol. Do not ride with someone who has been drinking. ? When you are tired or distracted. ? While texting.  Wear a helmet and other protective equipment during sports activities.  If you have firearms in your house, make sure you follow all gun safety procedures.  Seek help if you have been physically or sexually abused. What's next?  Go to your health care provider once a year for an annual wellness visit.  Ask your health care provider how often you should have your eyes and teeth checked.  Stay up to date on all vaccines. This information is not intended to replace advice given to you by your health care provider. Make sure you discuss any questions you have with your health care provider. Document Revised: 05/03/2020 Document Reviewed: 05/17/2018 Elsevier Patient Education  2021 [employer].

## 2021-01-15 ENCOUNTER — Other Ambulatory Visit (INDEPENDENT_AMBULATORY_CARE_PROVIDER_SITE_OTHER): Payer: BC Managed Care – PPO

## 2021-01-15 ENCOUNTER — Other Ambulatory Visit: Payer: Self-pay

## 2021-01-15 DIAGNOSIS — Z136 Encounter for screening for cardiovascular disorders: Secondary | ICD-10-CM

## 2021-01-15 DIAGNOSIS — Z0000 Encounter for general adult medical examination without abnormal findings: Secondary | ICD-10-CM | POA: Diagnosis not present

## 2021-01-15 DIAGNOSIS — Z13220 Encounter for screening for lipoid disorders: Secondary | ICD-10-CM | POA: Diagnosis not present

## 2021-01-15 LAB — LIPID PANEL
Cholesterol: 182 mg/dL (ref 0–200)
HDL: 58.3 mg/dL (ref >39.00)
LDL Cholesterol: 110 mg/dL — ABNORMAL HIGH (ref 0–99)
NonHDL: 124.11
Total CHOL/HDL Ratio: 3
Triglycerides: 70 mg/dL (ref 0.0–149.0)
VLDL: 14 mg/dL (ref 0.0–40.0)

## 2021-01-15 LAB — CBC
HCT: 36.3 % (ref 36.0–46.0)
Hemoglobin: 12.2 g/dL (ref 12.0–15.0)
MCHC: 33.7 g/dL (ref 30.0–36.0)
MCV: 94.1 fl (ref 78.0–100.0)
Platelets: 256 10*3/uL (ref 150.0–400.0)
RBC: 3.86 Mil/uL — ABNORMAL LOW (ref 3.87–5.11)
RDW: 14.1 % (ref 11.5–15.5)
WBC: 6 10*3/uL (ref 4.0–10.5)

## 2021-01-15 LAB — TSH: TSH: 1.49 u[IU]/mL (ref 0.35–4.50)

## 2021-01-15 NOTE — Progress Notes (Signed)
Per orders of Katrina Knight pt is here for labs, pt tolerated lab draw well.

## 2021-04-06 ENCOUNTER — Other Ambulatory Visit: Payer: Self-pay | Admitting: Nurse Practitioner

## 2021-04-06 DIAGNOSIS — G43719 Chronic migraine without aura, intractable, without status migrainosus: Secondary | ICD-10-CM

## 2021-04-06 NOTE — Telephone Encounter (Signed)
Chart supports refill Last ov 01/14/2021 Last refill 02/10/2021

## 2021-09-08 DIAGNOSIS — J069 Acute upper respiratory infection, unspecified: Secondary | ICD-10-CM | POA: Diagnosis not present

## 2021-09-10 ENCOUNTER — Encounter: Payer: Self-pay | Admitting: Nurse Practitioner

## 2021-09-10 ENCOUNTER — Other Ambulatory Visit: Payer: Self-pay

## 2021-09-10 ENCOUNTER — Telehealth (INDEPENDENT_AMBULATORY_CARE_PROVIDER_SITE_OTHER): Payer: BC Managed Care – PPO | Admitting: Nurse Practitioner

## 2021-09-10 ENCOUNTER — Other Ambulatory Visit: Payer: Self-pay | Admitting: Nurse Practitioner

## 2021-09-10 DIAGNOSIS — R051 Acute cough: Secondary | ICD-10-CM | POA: Insufficient documentation

## 2021-09-10 DIAGNOSIS — R0982 Postnasal drip: Secondary | ICD-10-CM | POA: Insufficient documentation

## 2021-09-10 DIAGNOSIS — U071 COVID-19: Secondary | ICD-10-CM | POA: Diagnosis not present

## 2021-09-10 MED ORDER — FLUTICASONE PROPIONATE 50 MCG/ACT NA SUSP: 2.0000 | Freq: Every day | NASAL | 0 refills | Status: DC

## 2021-09-10 MED ORDER — BENZONATATE 200 MG PO CAPS: 200.0000 mg | ORAL_CAPSULE | Freq: Three times a day (TID) | ORAL | 0 refills | Status: AC | PRN

## 2021-09-10 NOTE — Assessment & Plan Note (Signed)
Tested positive for covid 19. Look well over video.  Given age and lack of health conditions does not qualify for antiviral treatment.  Did discuss CDC guidelines in regards to quarantining.  Also discussed signs and symptoms when to seek urgent or emergent health care.  We will send in symptomatic medications/treatment

## 2021-09-10 NOTE — Assessment & Plan Note (Signed)
Start Tessalon Perles 200 mg during cough

## 2021-09-10 NOTE — Assessment & Plan Note (Signed)
Start Flonase 2 sprays each nostril 1 day.  Did give precautions in regards to nasal steroid use.

## 2021-09-10 NOTE — Progress Notes (Signed)
Patient ID: Katrina Knight, female    DOB: Jun 01, 1996, 25 y.o.   MRN: [id number]  Virtual visit completed through [employer], a video enabled telemedicine application. Due to national recommendations of social distancing due to COVID-19, a virtual visit is felt to be most appropriate for this patient at this time. Reviewed limitations, risks, security and privacy concerns of performing a virtual visit and the availability of in person appointments. I also reviewed that there may be a patient responsible charge related to this service. The patient agreed to proceed.   Patient location: home Provider location: [place] at [facility], office Persons participating in this virtual visit: patient, provider   If any vitals were documented, they were collected by patient at home unless specified below.    LMP 08/17/2021    CC: Covid 19 Subjective:   HPI: Katrina Knight is a 25 y.o. female presenting on 09/10/2021 for Covid Positive (On 09/09/21, sx started on 09/08/21-fever at first, sore throat, cough, stuffy nose, ears stopped up, fatigue.)  Symptoms on 09/08/2021 Tested for covid on on 09/09/2021. At home test Pfizer x2 and 1 booster Boyfriend also tested positive the same day Dayquill, nyquill, tylenol, ibuprofen, cough drop. Little relief    LMP approx 3 weeks ago  Relevant past medical, surgical, family and social history reviewed and updated as indicated. Interim medical history since our last visit reviewed. Allergies and medications reviewed and updated. Outpatient Medications Prior to Visit  Medication Sig Dispense Refill   AUROVELA FE 1.5/30 1.5-30 MG-MCG tablet Take 1 tablet by mouth daily.     cetirizine (ZYRTEC) 10 MG tablet Take 1 tablet (10 mg total) by mouth daily. 7 tablet 0   meclizine (ANTIVERT) 25 MG tablet Take 25 mg by mouth 3 (three) times daily as needed for dizziness.     ondansetron (ZOFRAN) 8 MG tablet Take by mouth every 8 (eight) hours as needed for nausea or  vomiting.     topiramate (TOPAMAX) 25 MG tablet TAKE 1 TABLET BY MOUTH DAILY FOR 2 WEEKS THEN TAKE 1 TABLET BY MOUTH TWICE DAILY 60 tablet 0   No facility-administered medications prior to visit.     Per HPI unless specifically indicated in ROS section below Review of Systems  Constitutional:  Positive for fatigue. Negative for chills and fever.  HENT:  Positive for congestion, ear pain (full and pressure), postnasal drip and sore throat. Negative for sinus pressure and sinus pain.   Respiratory:  Positive for cough. Negative for shortness of breath.   Cardiovascular:  Negative for chest pain.  Gastrointestinal:  Negative for abdominal pain, diarrhea, nausea and vomiting.  Musculoskeletal:  Positive for myalgias. Negative for arthralgias.  Neurological:  Negative for headaches.  Objective:  LMP 08/17/2021   Wt Readings from Last 3 Encounters:  01/14/21 233 lb 3.2 oz (105.8 kg)  10/02/20 244 lb (110.7 kg)  07/17/20 244 lb (110.7 kg)       Physical exam: Gen: alert, NAD, not ill appearing Pulm: speaks in complete sentences without increased work of breathing Psych: normal mood, normal thought content      Results for orders placed or performed in visit on 01/15/21  TSH  Result Value Ref Range   TSH 1.49 0.35 - 4.50 uIU/mL  Lipid panel  Result Value Ref Range   Cholesterol 182 0 - 200 mg/dL   Triglycerides 71.0 0.0 - 149.0 mg/dL   HDL 62.69 >48.54 mg/dL   VLDL 62.7 0.0 - 03.5 mg/dL   LDL Cholesterol  110 (H) 0 - 99 mg/dL   Total CHOL/HDL Ratio 3    NonHDL 124.11   CBC  Result Value Ref Range   WBC 6.0 4.0 - 10.5 K/uL   RBC 3.86 (L) 3.87 - 5.11 Mil/uL   Platelets 256.0 150.0 - 400.0 K/uL   Hemoglobin 12.2 12.0 - 15.0 g/dL   HCT 13.2 44.0 - 10.2 %   MCV 94.1 78.0 - 100.0 fl   MCHC 33.7 30.0 - 36.0 g/dL   RDW 72.5 36.6 - 44.0 %   Assessment & Plan:   Problem List Items Addressed This Visit       Other   Acute cough    Start Tessalon Perles 200 mg during cough       Relevant Medications   benzonatate (TESSALON) 200 MG capsule   PND (post-nasal drip) - Primary    Start Flonase 2 sprays each nostril 1 day.  Did give precautions in regards to nasal steroid use.      Relevant Medications   fluticasone (FLONASE) 50 MCG/ACT nasal spray   COVID-19    Tested positive for covid 19. Look well over video.  Given age and lack of health conditions does not qualify for antiviral treatment.  Did discuss CDC guidelines in regards to quarantining.  Also discussed signs and symptoms when to seek urgent or emergent health care.  We will send in symptomatic medications/treatment        No orders of the defined types were placed in this encounter.  No orders of the defined types were placed in this encounter.   I discussed the assessment and treatment plan with the patient. The patient was provided an opportunity to ask questions and all were answered. The patient agreed with the plan and demonstrated an understanding of the instructions. The patient was advised to call back or seek an in-person evaluation if the symptoms worsen or if the condition fails to improve as anticipated.  Follow up plan: No follow-ups on file.  Audria Nine, NP

## 2021-10-07 ENCOUNTER — Other Ambulatory Visit: Payer: Self-pay | Admitting: Nurse Practitioner

## 2021-10-07 DIAGNOSIS — R0982 Postnasal drip: Secondary | ICD-10-CM

## 2021-11-12 ENCOUNTER — Ambulatory Visit: Payer: BC Managed Care – PPO | Admitting: Nurse Practitioner

## 2021-11-12 ENCOUNTER — Other Ambulatory Visit: Payer: Self-pay

## 2021-11-12 ENCOUNTER — Encounter: Payer: Self-pay | Admitting: Nurse Practitioner

## 2021-11-12 VITALS — BP 128/78 | HR 111 | Temp 97.4°F | Ht 64.0 in | Wt 242.8 lb

## 2021-11-12 DIAGNOSIS — R1033 Periumbilical pain: Secondary | ICD-10-CM | POA: Diagnosis not present

## 2021-11-12 LAB — COMPREHENSIVE METABOLIC PANEL
ALT: 17 U/L (ref 0–35)
AST: 14 U/L (ref 0–37)
Albumin: 4.2 g/dL (ref 3.5–5.2)
Alkaline Phosphatase: 57 U/L (ref 39–117)
BUN: 10 mg/dL (ref 6–23)
CO2: 28 mEq/L (ref 19–32)
Calcium: 9.1 mg/dL (ref 8.4–10.5)
Chloride: 105 mEq/L (ref 96–112)
Creatinine, Ser: 0.56 mg/dL (ref 0.40–1.20)
GFR: 127.55 mL/min (ref >60.00)
Glucose, Bld: 93 mg/dL (ref 70–99)
Potassium: 4 mEq/L (ref 3.5–5.1)
Sodium: 137 mEq/L (ref 135–145)
Total Bilirubin: 0.6 mg/dL (ref 0.2–1.2)
Total Protein: 7.2 g/dL (ref 6.0–8.3)

## 2021-11-12 LAB — CBC WITH DIFFERENTIAL/PLATELET
Basophils Absolute: 0 10*3/uL (ref 0.0–0.1)
Basophils Relative: 0.5 % (ref 0.0–3.0)
Eosinophils Absolute: 0.1 10*3/uL (ref 0.0–0.7)
Eosinophils Relative: 1.1 % (ref 0.0–5.0)
HCT: 38.1 % (ref 36.0–46.0)
Hemoglobin: 12.8 g/dL (ref 12.0–15.0)
Lymphocytes Relative: 23 % (ref 12.0–46.0)
Lymphs Abs: 1.2 10*3/uL (ref 0.7–4.0)
MCHC: 33.6 g/dL (ref 30.0–36.0)
MCV: 93.6 fl (ref 78.0–100.0)
Monocytes Absolute: 0.6 10*3/uL (ref 0.1–1.0)
Monocytes Relative: 11 % (ref 3.0–12.0)
Neutro Abs: 3.4 10*3/uL (ref 1.4–7.7)
Neutrophils Relative %: 64.4 % (ref 43.0–77.0)
Platelets: 224 10*3/uL (ref 150.0–400.0)
RBC: 4.07 Mil/uL (ref 3.87–5.11)
RDW: 14 % (ref 11.5–15.5)
WBC: 5.3 10*3/uL (ref 4.0–10.5)

## 2021-11-12 LAB — LIPASE: Lipase: 28 U/L (ref 11.0–59.0)

## 2021-11-12 NOTE — Progress Notes (Signed)
Subjective:  Patient ID: Katrina Knight, female    DOB: [birth year]  Age: 26 y.o. MRN: [id number]  CC: Acute Visit (Pt c/o sharp stomach pains x 2 days. Cramp feeling started yesterday evening at 2pm and lasted all day and woke her up in the middle of the night. )  Abdominal Pain This is a new problem. The current episode started yesterday. The onset quality is gradual. The problem occurs constantly. The problem has been waxing and waning. The pain is located in the periumbilical region. The pain is moderate. The quality of the pain is colicky and a sensation of fullness. The abdominal pain does not radiate. Associated symptoms include belching and nausea. Pertinent negatives include no anorexia, arthralgias, constipation, diarrhea, dysuria, fever, flatus, frequency, headaches, hematochezia, hematuria, melena, myalgias, vomiting or weight loss. The pain is aggravated by palpation. The pain is relieved by Nothing. Treatments tried: ibuprofen. There is no history of abdominal surgery, colon cancer, Crohn's disease, irritable bowel syndrome, pancreatitis or PUD.  Denies any GERD symptoms Denies any vaginal symptoms or dyspareunia Home pregnancy test yesterday: negative Takes COC as prescribed  Reviewed past Medical, Social and Family history today.  Outpatient Medications Prior to Visit  Medication Sig Dispense Refill   AUROVELA FE 1.5/30 1.5-30 MG-MCG tablet Take 1 tablet by mouth daily.     fluticasone (FLONASE) 50 MCG/ACT nasal spray Place 2 sprays into both nostrils daily. (Patient not taking: Reported on 11/12/2021) 16 g 0   No facility-administered medications prior to visit.   ROS See HPI  Objective:  BP 128/78 (BP Location: Left Arm, Patient Position: Sitting, Cuff Size: Large)    Pulse (!) 111    Temp (!) 97.4 F (36.3 C) (Temporal)    Ht 5\' 4"  (1.626 m)    Wt 242 lb 12.8 oz (110.1 kg)    SpO2 98%    BMI 41.68 kg/m   Physical Exam Vitals reviewed.  Constitutional:      General:  She is not in acute distress. Cardiovascular:     Rate and Rhythm: Normal rate.     Pulses: Normal pulses.  Pulmonary:     Effort: Pulmonary effort is normal.  Abdominal:     General: Bowel sounds are normal. There is no distension.     Palpations: Abdomen is soft.     Tenderness: There is abdominal tenderness. There is no right CVA tenderness, left CVA tenderness or guarding.  Neurological:     Mental Status: She is alert and oriented to person, place, and time.   Assessment & Plan:  This visit occurred during the SARS-CoV-2 public health emergency.  Safety protocols were in place, including screening questions prior to the visit, additional usage of staff PPE, and extensive cleaning of exam room while observing appropriate contact time as indicated for disinfecting solutions.   [name] was seen today for acute visit.  Diagnoses and all orders for this visit:  Periumbilical abdominal pain -     Cancel: POCT urinalysis dipstick -     CBC with Differential/Platelet -     Lipase -     Comprehensive metabolic panel -     Urinalysis w microscopic + reflex cultur; Future  Unable to provide urine sample today. Will return to lab. Normal CBC, CMP and lipase.  Problem List Items Addressed This Visit   None Visit Diagnoses     Periumbilical abdominal pain    -  Primary   Relevant Orders   CBC with Differential/Platelet (Completed)  Lipase (Completed)   Comprehensive metabolic panel (Completed)   Urinalysis w microscopic + reflex cultur       Follow-up: Return if symptoms worsen or fail to improve.  Wilfred Lacy, NP

## 2021-11-12 NOTE — Patient Instructions (Signed)
Try peptobismol and tylenol as directed on package for pain if needed Maintain BRAT diet.  Go to lab for blood draw and urine collection.  Abdominal Pain, Adult Many things can cause belly (abdominal) pain. Most times, belly pain is not dangerous. Many cases of belly pain can be watched and treated at home. Sometimes, though, belly pain is serious. Your doctor will try to find the cause of your belly pain. Follow these instructions at home: Medicines Take over-the-counter and prescription medicines only as told by your doctor. Do not take medicines that help you poop (laxatives) unless told by your doctor. General instructions Watch your belly pain for any changes. Drink enough fluid to keep your pee (urine) pale yellow. Keep all follow-up visits as told by your doctor. This is important. Contact a doctor if: Your belly pain changes or gets worse. You are not hungry, or you lose weight without trying. You are having trouble pooping (constipated) or have watery poop (diarrhea) for more than 2-3 days. You have pain when you pee or poop. Your belly pain wakes you up at night. Your pain gets worse with meals, after eating, or with certain foods. You are vomiting and cannot keep anything down. You have a fever. You have blood in your pee. Get help right away if: Your pain does not go away as soon as your doctor says it should. You cannot stop vomiting. Your pain is only in areas of your belly, such as the right side or the left lower part of the belly. You have bloody or black poop, or poop that looks like tar. You have very bad pain, cramping, or bloating in your belly. You have signs of not having enough fluid or water in your body (dehydration), such as: Dark pee, very little pee, or no pee. Cracked lips. Dry mouth. Sunken eyes. Sleepiness. Weakness. You have trouble breathing or chest pain. Summary Many cases of belly pain can be watched and treated at home. Watch your belly  pain for any changes. Take over-the-counter and prescription medicines only as told by your doctor. Contact a doctor if your belly pain changes or gets worse. Get help right away if you have very bad pain, cramping, or bloating in your belly. This information is not intended to replace advice given to you by your health care provider. Make sure you discuss any questions you have with your health care provider. Document Revised: 01/14/2019 Document Reviewed: 01/14/2019 Elsevier Patient Education  2022 [employer].

## 2021-11-15 ENCOUNTER — Other Ambulatory Visit: Payer: BC Managed Care – PPO

## 2021-11-15 ENCOUNTER — Other Ambulatory Visit: Payer: Self-pay

## 2021-11-15 DIAGNOSIS — R1033 Periumbilical pain: Secondary | ICD-10-CM | POA: Diagnosis not present

## 2021-11-16 LAB — URINALYSIS W MICROSCOPIC + REFLEX CULTURE
Bacteria, UA: NONE SEEN /HPF
Bilirubin Urine: NEGATIVE
Glucose, UA: NEGATIVE
Hgb urine dipstick: NEGATIVE
Hyaline Cast: NONE SEEN /LPF
Ketones, ur: NEGATIVE
Leukocyte Esterase: NEGATIVE
Nitrites, Initial: NEGATIVE
Protein, ur: NEGATIVE
RBC / HPF: NONE SEEN /HPF (ref 0–2)
Specific Gravity, Urine: 1.016 (ref 1.001–1.035)
WBC, UA: NONE SEEN /HPF (ref 0–5)
pH: 6 (ref 5.0–8.0)

## 2021-11-16 LAB — NO CULTURE INDICATED

## 2021-12-21 DIAGNOSIS — Z118 Encounter for screening for other infectious and parasitic diseases: Secondary | ICD-10-CM | POA: Diagnosis not present

## 2021-12-21 DIAGNOSIS — Z01419 Encounter for gynecological examination (general) (routine) without abnormal findings: Secondary | ICD-10-CM | POA: Diagnosis not present

## 2021-12-21 DIAGNOSIS — Z6841 Body Mass Index (BMI) 40.0 and over, adult: Secondary | ICD-10-CM | POA: Diagnosis not present

## 2021-12-21 DIAGNOSIS — Z3041 Encounter for surveillance of contraceptive pills: Secondary | ICD-10-CM | POA: Diagnosis not present

## 2022-01-18 ENCOUNTER — Ambulatory Visit (INDEPENDENT_AMBULATORY_CARE_PROVIDER_SITE_OTHER): Payer: BC Managed Care – PPO | Admitting: Nurse Practitioner

## 2022-01-18 ENCOUNTER — Encounter: Payer: Self-pay | Admitting: Nurse Practitioner

## 2022-01-18 VITALS — BP 126/84 | HR 86 | Temp 97.2°F | Ht 63.75 in | Wt 243.0 lb

## 2022-01-18 DIAGNOSIS — Z0000 Encounter for general adult medical examination without abnormal findings: Secondary | ICD-10-CM | POA: Diagnosis not present

## 2022-01-18 NOTE — Patient Instructions (Addendum)
Sign medical release form to get records from [facility]. ? ?Maintain heart healthy diet and daily exercise. ? ?Preventive Care 82-25 Years Old, Female ?Preventive care refers to lifestyle choices and visits with your health care provider that can promote health and wellness. Preventive care visits are also called wellness exams. ?What can I expect for my preventive care visit? ?Counseling ?During your preventive care visit, your health care provider may ask about your: ?Medical history, including: ?Past medical problems. ?Family medical history. ?Pregnancy history. ?Current health, including: ?Menstrual cycle. ?Method of birth control. ?Emotional well-being. ?Home life and relationship well-being. ?Sexual activity and sexual health. ?Lifestyle, including: ?Alcohol, nicotine or tobacco, and drug use. ?Access to firearms. ?Diet, exercise, and sleep habits. ?Work and work Statistician. ?Sunscreen use. ?Safety issues such as seatbelt and bike helmet use. ?Physical exam ?Your health care provider may check your: ?Height and weight. These may be used to calculate your BMI (body mass index). BMI is a measurement that tells if you are at a healthy weight. ?Waist circumference. This measures the distance around your waistline. This measurement also tells if you are at a healthy weight and may help predict your risk of certain diseases, such as type 2 diabetes and high blood pressure. ?Heart rate and blood pressure. ?Body temperature. ?Skin for abnormal spots. ?What immunizations do I need? ? ?Vaccines are usually given at various ages, according to a schedule. Your health care provider will recommend vaccines for you based on your age, medical history, and lifestyle or other factors, such as travel or where you work. ?What tests do I need? ?Screening ?Your health care provider may recommend screening tests for certain conditions. This may include: ?Pelvic exam and Pap test. ?Lipid and cholesterol levels. ?Diabetes screening. This  is done by checking your blood sugar (glucose) after you have not eaten for a while (fasting). ?Hepatitis B test. ?Hepatitis C test. ?HIV (human immunodeficiency virus) test. ?STI (sexually transmitted infection) testing, if you are at risk. ?BRCA-related cancer screening. This may be done if you have a family history of breast, ovarian, tubal, or peritoneal cancers. ?Talk with your health care provider about your test results, treatment options, and if necessary, the need for more tests. ?Follow these instructions at home: ?Eating and drinking ? ?Eat a healthy diet that includes fresh fruits and vegetables, whole grains, lean protein, and low-fat dairy products. ?Take vitamin and mineral supplements as recommended by your health care provider. ?Do not drink alcohol if: ?Your health care provider tells you not to drink. ?You are pregnant, may be pregnant, or are planning to become pregnant. ?If you drink alcohol: ?Limit how much you have to 0-1 drink a day. ?Know how much alcohol is in your drink. In the [place] one drink equals one 12 oz bottle of beer (355 mL), one 5 oz glass of wine (148 mL), or one 1? oz glass of hard liquor (44 mL). ?Lifestyle ?Brush your teeth every morning and night with fluoride toothpaste. Floss one time each day. ?Exercise for at least 30 minutes 5 or more days each week. ?Do not use any products that contain nicotine or tobacco. These products include cigarettes, chewing tobacco, and vaping devices, such as e-cigarettes. If you need help quitting, ask your health care provider. ?Do not use drugs. ?If you are sexually active, practice safe sex. Use a condom or other form of protection to prevent STIs. ?If you do not wish to become pregnant, use a form of birth control. If you plan to become pregnant,  see your health care provider for a prepregnancy visit. ?Find healthy ways to manage stress, such as: ?Meditation, yoga, or listening to music. ?Journaling. ?Talking to a trusted  person. ?Spending time with friends and family. ?Minimize exposure to UV radiation to reduce your risk of skin cancer. ?Safety ?Always wear your seat belt while driving or riding in a vehicle. ?Do not drive: ?If you have been drinking alcohol. Do not ride with someone who has been drinking. ?If you have been using any mind-altering substances or drugs. ?While texting. ?When you are tired or distracted. ?Wear a helmet and other protective equipment during sports activities. ?If you have firearms in your house, make sure you follow all gun safety procedures. ?Seek help if you have been physically or sexually abused. ?What's next? ?Go to your health care provider once a year for an annual wellness visit. ?Ask your health care provider how often you should have your eyes and teeth checked. ?Stay up to date on all vaccines. ?This information is not intended to replace advice given to you by your health care provider. Make sure you discuss any questions you have with your health care provider. ?Document Revised: 03/03/2021 Document Reviewed: 03/03/2021 ?Elsevier Patient Education ? [employer]. ? ? ?

## 2022-01-18 NOTE — Progress Notes (Signed)
? ?Complete physical exam ? ?Patient: Katrina Knight   DOB: 1996-08-23   25 y.o. Female  MRN: [id number] ?Visit Date: 01/18/2022 ? ?Subjective:  ?  ?Chief Complaint  ?Patient presents with  ? Annual Exam  ?  Physical-No breast or pap exam needed. ?Pt is not fasting.   ? ? ?Katrina Knight is a 26 y.o. female who presents today for a complete physical exam. She reports consuming a low fat diet.  She generally feels well. She reports sleeping fairly well. She does not have additional problems to discuss today.  ?Vision:Yes ?Dental:Within Last 6 months ?STD Screen:No ?Wt Readings from Last 3 Encounters:  ?01/18/22 243 lb (110.2 kg)  ?11/12/21 242 lb 12.8 oz (110.1 kg)  ?01/14/21 233 lb 3.2 oz (105.8 kg)  ?  ?Most recent fall risk assessment: ? ?  01/14/2021  ? 10:06 AM  ?Fall Risk   ?Falls in the past year? 0  ?Number falls in past yr: 0  ?Injury with Fall? 0  ?Risk for fall due to : No Fall Risks  ?Follow up Falls evaluation completed  ? ?Most recent depression screenings: ? ?  01/18/2022  ? 11:22 AM 01/14/2021  ? 10:34 AM  ?PHQ 2/9 Scores  ?PHQ - 2 Score 0 0  ?PHQ- 9 Score 3 3  ? ? ?HPI  ?No problem-specific Assessment & Plan notes found for this encounter. ? ? ?Past Medical History:  ?Diagnosis Date  ? Allergy   ? Seasonal allergies, dust mites  ? Anxiety   ? Depression   ? ?Past Surgical History:  ?Procedure Laterality Date  ? KIDNEY STONE SURGERY    ? 2017 and 2018  ? ?Social History  ? ?Socioeconomic History  ? Marital status: Single  ?  Spouse name: Not on file  ? Number of children: 0  ? Years of education: Not on file  ? Highest education level: Not on file  ?Occupational History  ? Not on file  ?Tobacco Use  ? Smoking status: Never  ? Smokeless tobacco: Never  ?Vaping Use  ? Vaping Use: Never used  ?Substance and Sexual Activity  ? Alcohol use: Yes  ?  Comment: Occasionally  ? Drug use: Never  ? Sexual activity: Yes  ?  Birth control/protection: Pill  ?Other Topics Concern  ? Not on file  ?Social History Narrative  ?  Not on file  ? ?Social Determinants of Health  ? ?Financial Resource Strain: Not on file  ?Food Insecurity: Not on file  ?Transportation Needs: Not on file  ?Physical Activity: Not on file  ?Stress: Not on file  ?Social Connections: Not on file  ?Intimate Partner Violence: Not on file  ? ?Family Status  ?Relation Name Status  ? Mother Mother Alive  ? Father  Alive  ? MGF Grandfather (Not Specified)  ? MGM Grandmother (Not Specified)  ? Sister Sister (Not Specified)  ? ?Family History  ?Problem Relation Age of Onset  ? Cancer Mother   ?     Breast cancer, negative BRCA gene  ? Hypertension Mother   ? Migraines Mother   ? Sleep apnea Mother   ? Alcohol abuse Maternal Grandfather   ? Depression Maternal Grandmother   ? ADD / ADHD Sister   ? ?No Known Allergies  ?Patient Care Team: ?Akshita Italiano, Charlene Brooke, NP as PCP - General (Internal Medicine) ?Charyl Bigger, MD as Consulting Physician (Obstetrics and Gynecology)  ? ?Medications: ?Outpatient Medications Prior to Visit  ?Medication Sig  ? [place] FE  1.5/30 1.5-30 MG-MCG tablet Take 1 tablet by mouth daily.  ? ?No facility-administered medications prior to visit.  ? ? ?Review of Systems  ?Constitutional:  Negative for fever.  ?HENT:  Negative for congestion and sore throat.   ?Eyes:   ?     Negative for visual changes  ?Respiratory:  Negative for cough and shortness of breath.   ?Cardiovascular:  Negative for chest pain, palpitations and leg swelling.  ?Gastrointestinal:  Negative for blood in stool, constipation and diarrhea.  ?Genitourinary:  Negative for dysuria, frequency and urgency.  ?Musculoskeletal:  Negative for myalgias.  ?Skin:  Negative for rash.  ?Neurological:  Negative for dizziness and headaches.  ?Hematological:  Does not bruise/bleed easily.  ?Psychiatric/Behavioral:  Negative for suicidal ideas. The patient is not nervous/anxious.   ? ? ?   ?Objective:  ?BP 126/84 (BP Location: Left Arm, Patient Position: Sitting, Cuff Size: Large)   Pulse 86    Temp (!) 97.2 ?F (36.2 ?C) (Temporal)   Ht 5' 3.75" (1.619 m)   Wt 243 lb (110.2 kg)   LMP 01/04/2022 (Exact Date)   SpO2 97%   BMI 42.04 kg/m?  ?  ? ? ? ?Physical Exam ?Constitutional:   ?   General: She is not in acute distress. ?   Appearance: She is obese.  ?HENT:  ?   Right Ear: Tympanic membrane, ear canal and external ear normal.  ?   Left Ear: Tympanic membrane, ear canal and external ear normal.  ?   Nose: Nose normal.  ?Eyes:  ?   General: No scleral icterus. ?   Extraocular Movements: Extraocular movements intact.  ?   Conjunctiva/sclera: Conjunctivae normal.  ?Cardiovascular:  ?   Rate and Rhythm: Normal rate and regular rhythm.  ?   Pulses: Normal pulses.  ?   Heart sounds: Normal heart sounds.  ?Pulmonary:  ?   Effort: Pulmonary effort is normal. No respiratory distress.  ?   Breath sounds: Normal breath sounds.  ?Chest:  ?   Comments: Deferred breast and pelvic exam to GYN ?Abdominal:  ?   General: Bowel sounds are normal. There is no distension.  ?   Palpations: Abdomen is soft.  ?Musculoskeletal:     ?   General: Normal range of motion.  ?   Cervical back: Normal range of motion and neck supple.  ?   Right lower leg: No edema.  ?   Left lower leg: No edema.  ?Lymphadenopathy:  ?   Cervical: No cervical adenopathy.  ?Skin: ?   General: Skin is warm and dry.  ?Neurological:  ?   Mental Status: She is alert and oriented to person, place, and time.  ?Psychiatric:     ?   Mood and Affect: Mood normal.     ?   Behavior: Behavior normal.     ?   Thought Content: Thought content normal.  ?  ?No results found for any visits on 01/18/22. ?   ?Assessment & Plan:  ?  ?Routine Health Maintenance and Physical Exam ? ?Immunization History  ?Administered Date(s) Administered  ? HPV 9-valent 03/21/2011, 05/26/2011, 09/22/2011  ? Influenza,inj,Quad PF,6+ Mos 07/17/2020  ? Influenza,inj,Quad PF,6-35 Mos 09/22/2011  ? Influenza-Unspecified 09/22/2011  ? Meningococcal B, OMV 02/22/2018, 03/26/2018  ? Meningococcal  Conjugate 03/05/2015  ? PFIZER(Purple Top)SARS-COV-2 Vaccination 11/27/2019, 12/18/2019, 08/26/2020  ? Tdap 02/22/2018  ? ? ?Health Maintenance  ?Topic Date Due  ? PAP-Cervical Cytology Screening  Never done  ? PAP SMEAR-Modifier  Never done  ? COVID-19 Vaccine (4 - Booster for Pfizer series) 10/21/2020  ? Hepatitis C Screening  01/19/2023 (Originally 03/29/2015)  ? HIV Screening  01/19/2023 (Originally 03/28/2012)  ? INFLUENZA VACCINE  04/19/2022  ? TETANUS/TDAP  02/23/2028  ? HPV VACCINES  Completed  ? ?Discussed health benefits of physical activity, and encouraged her to engage in regular exercise appropriate for her age and condition. ?Reviewed labs completed 11/12/2021: CBC and CMP (normal). ?Will obtain PAP results from GYN. ? ?Problem List Items Addressed This Visit   ?None ?Visit Diagnoses   ? ? Preventative health care    -  Primary  ? ?  ? ?Return in about 1 year (around 01/19/2023) for CPE (fasting). ? ?  ? ?Wilfred Lacy, NP ?

## 2022-02-01 ENCOUNTER — Encounter (HOSPITAL_BASED_OUTPATIENT_CLINIC_OR_DEPARTMENT_OTHER): Payer: Self-pay | Admitting: Emergency Medicine

## 2022-02-01 ENCOUNTER — Emergency Department (HOSPITAL_BASED_OUTPATIENT_CLINIC_OR_DEPARTMENT_OTHER): Payer: BC Managed Care – PPO

## 2022-02-01 ENCOUNTER — Other Ambulatory Visit: Payer: Self-pay

## 2022-02-01 ENCOUNTER — Emergency Department (HOSPITAL_BASED_OUTPATIENT_CLINIC_OR_DEPARTMENT_OTHER)
Admission: EM | Admit: 2022-02-01 | Discharge: 2022-02-01 | Disposition: A | Discharge status: Home / Self Care | Payer: BC Managed Care – PPO | Attending: Emergency Medicine | Admitting: Emergency Medicine

## 2022-02-01 DIAGNOSIS — M79662 Pain in left lower leg: Secondary | ICD-10-CM | POA: Diagnosis not present

## 2022-02-01 DIAGNOSIS — M7989 Other specified soft tissue disorders: Secondary | ICD-10-CM | POA: Diagnosis not present

## 2022-02-01 DIAGNOSIS — M79605 Pain in left leg: Secondary | ICD-10-CM | POA: Diagnosis not present

## 2022-02-01 HISTORY — DX: Migraine, unspecified, not intractable, without status migrainosus: G43.909

## 2022-02-01 MED ORDER — METHOCARBAMOL 500 MG PO TABS: 500.0000 mg | ORAL_TABLET | Freq: Two times a day (BID) | ORAL | 0 refills | Status: DC

## 2022-02-01 NOTE — ED Triage Notes (Signed)
Lower calf pain X 3 days, started as charley horse that woke her up from sleep. Has been sore since. Has been traveling and went a concert with a lot of standing. Called PCP who reccommended UC. UC reccommended ED for imaging.  ?

## 2022-02-01 NOTE — ED Provider Notes (Signed)
?[facility] EMERGENCY DEPARTMENT ?Provider Note ? ? ?CSN: [id number] ?Arrival date & time: 02/01/22  1930 ? ?  ? ?History ? ?Chief Complaint  ?Patient presents with  ? Leg Pain  ? ? ?Katrina MartyrCasey Knight is a 26 y.o. female. ? ? ?Leg Pain ? ?Patient is a 26 year old female presented emergency room today with complaints of 4 days of left lower extremity pain seems to be located in the calf.  She states it is achy constant worse with walking.  She states she had a charley horse during the day she did [place] 10[place] and she states during that time is when she had the charley horse.  She states she has had residual pain since.  She went to an urgent care and they instructed her to come to the emergency room.  No chest pain difficulty breathing ?Patient does not smoke but is on an estrogen containing OCP ? ? ?  ? ?Home Medications ?Prior to Admission medications   ?Medication Sig Start Date End Date Taking? Authorizing Provider  ?methocarbamol (ROBAXIN) 500 MG tablet Take 1 tablet (500 mg total) by mouth 2 (two) times daily. 02/01/22  Yes Gailen ShelterFondaw, Courtenay Creger S, [place]  ?[place] FE 1.5/30 1.5-30 MG-MCG tablet Take 1 tablet by mouth daily. 06/05/20   [provider]  ?   ? ?Allergies    ?Patient has no known allergies.   ? ?Review of Systems   ?Review of Systems ? ?Physical Exam ?Updated Vital Signs ?BP 116/78   Pulse 67   Temp 98.9 ?F (37.2 ?C) (Oral)   Resp 14   Ht 5\' 3"  (1.6 m)   Wt 110.2 kg   LMP 12/29/2021 (Exact Date)   SpO2 98%   BMI 43.05 kg/m?  ?Physical Exam ?Vitals and nursing note reviewed.  ?Constitutional:   ?   General: She is not in acute distress. ?HENT:  ?   Head: Normocephalic and atraumatic.  ?   Nose: Nose normal.  ?Eyes:  ?   General: No scleral icterus. ?Cardiovascular:  ?   Rate and Rhythm: Normal rate and regular rhythm.  ?   Pulses: Normal pulses.  ?   Heart sounds: Normal heart sounds.  ?Pulmonary:  ?   Effort: Pulmonary effort is normal. No respiratory  distress.  ?   Breath sounds: No wheezing.  ?Abdominal:  ?   Palpations: Abdomen is soft.  ?   Tenderness: There is no abdominal tenderness.  ?Musculoskeletal:  ?   Cervical back: Normal range of motion.  ?   Right lower leg: No edema.  ?   Left lower leg: No edema.  ?   Comments: Legs appear symmetric.  Left calf with some tenderness  ?Skin: ?   General: Skin is warm and dry.  ?   Capillary Refill: Capillary refill takes less than 2 seconds.  ?Neurological:  ?   Mental Status: She is alert. Mental status is at baseline.  ?Psychiatric:     ?   Mood and Affect: Mood normal.     ?   Behavior: Behavior normal.  ? ? ?ED Results / Procedures / Treatments   ?Labs ?(all labs ordered are listed, but only abnormal results are displayed) ?Labs Reviewed - No data to display ? ?EKG ?None ? ?Radiology ?US Venous Img Lower Unilateral Left ? ?Result Date: 02/01/2022 ?CLINICAL DATA:  Left leg pain and swelling EXAM: LEFT LOWER EXTREMITY VENOUS DOPPLER ULTRASOUND TECHNIQUE: Gray-scale sonography with graded compression, as well as  color Doppler and duplex ultrasound were performed to evaluate the lower extremity deep venous systems from the level of the common femoral vein and including the common femoral, femoral, profunda femoral, popliteal and calf veins including the posterior tibial, peroneal and gastrocnemius veins when visible. The superficial great saphenous vein was also interrogated. Spectral Doppler was utilized to evaluate flow at rest and with distal augmentation maneuvers in the common femoral, femoral and popliteal veins. COMPARISON:  None Available. FINDINGS: Contralateral Common Femoral Vein: Respiratory phasicity is normal and symmetric with the symptomatic side. No evidence of thrombus. Normal compressibility. Common Femoral Vein: No evidence of thrombus. Normal compressibility, respiratory phasicity and response to augmentation. Saphenofemoral Junction: No evidence of thrombus. Normal compressibility and flow on  color Doppler imaging. Profunda Femoral Vein: No evidence of thrombus. Normal compressibility and flow on color Doppler imaging. Femoral Vein: No evidence of thrombus. Normal compressibility, respiratory phasicity and response to augmentation. Popliteal Vein: No evidence of thrombus. Normal compressibility, respiratory phasicity and response to augmentation. Calf Veins: No evidence of thrombus. Normal compressibility and flow on color Doppler imaging. Superficial Great Saphenous Vein: No evidence of thrombus. Normal compressibility. Venous Reflux:  None. Other Findings:  None. IMPRESSION: No evidence of deep venous thrombosis. Electronically Signed   By: Alcide Clever M.D.   On: 02/01/2022 22:10   ? ?Procedures ?Procedures  ? ? ?Medications Ordered in ED ?Medications - No data to display ? ?ED Course/ Medical Decision Making/ A&P ?Clinical Course as of 02/01/22 2304  ?Tue Feb 01, 2022  ?2254 Satruday charly hoarse  ?Sore sat (travel day) and Sunday walked 8 hours and it tensed and started hurting.  ? ?Travel time:  ? ?Today was more sore. Stretched some. Advil and heating pad.  [WF]  ?  ?Clinical Course User Index ?[WF] Gailen Shelter, [place]  ? ?                        ?Medical Decision Making ? ?Patient is a 26 year old female presented emergency room today with complaints of 4 days of left lower extremity pain seems to be located in the calf.  She states it is achy constant worse with walking.  She states she had a charley horse during the day she did have a 10-hour train ride to [place] and she states during that time is when she had the charley horse.  She states she has had residual pain since.  She went to an urgent care and they instructed her to come to the emergency room.  No chest pain difficulty breathing ?Patient does not smoke but is on an estrogen containing OCP ? ? ?Physical exam unremarkable apart from some Tenderness. ? ?Suspect muscular pain.  Offered to check electrolytes which she declined.   She states she can see her PCP tomorrow who is able to check basic menstruation magnesium per patient. ? ?Return precautions discussed.  Patient will be discharged home with Robaxin she understands mechanism of this is not muscle specific. ? ? ?Final Clinical Impression(s) / ED Diagnoses ?Final diagnoses:  ?Left leg pain  ?Pain of left calf  ? ? ?Rx / DC Orders ?ED Discharge Orders   ? ?      Ordered  ?  methocarbamol (ROBAXIN) 500 MG tablet  2 times daily       ? 02/01/22 2302  ? ?  ?  ? ?  ? ? ?  ?Gailen Shelter, [place] ?02/01/22 2305 ? ?  ?Zackowski,  Lorin Picket, MD ?02/02/22 1706 ? ?

## 2022-02-01 NOTE — Discharge Instructions (Signed)
Your ultrasound was without any evidence of a blood clot.  I recommend warm compresses, gentle stretching, Tylenol and ibuprofen as discussed below.  I have prescribed you a muscle relaxer.  Please follow-up with your primary care provider to check your electrolytes ?

## 2022-02-08 ENCOUNTER — Encounter: Payer: Self-pay | Admitting: Nurse Practitioner

## 2022-02-08 ENCOUNTER — Ambulatory Visit: Payer: BC Managed Care – PPO | Admitting: Nurse Practitioner

## 2022-02-08 VITALS — BP 120/80 | HR 76 | Temp 97.3°F | Ht 63.0 in | Wt 246.2 lb

## 2022-02-08 DIAGNOSIS — M79662 Pain in left lower leg: Secondary | ICD-10-CM

## 2022-02-08 NOTE — Progress Notes (Signed)
   Acute Office Visit  Subjective:    Patient ID: Katrina Knight, female    DOB: [birth year], 26 y.o.   MRN: [id number]  Chief Complaint  Patient presents with   Acute Visit    Had some pain In left leg x 3 days. F/u from urgent care visit. No other concerns   HPI Patient is in today for hx of left leg calf pain x 3days. Onset after trip to [place]. Trip involved prolonged sitting and walking. She denies any ankle of knee injury. No paresthesia. She was evaluated by ED provider 02/01/22: negative venus doppler. Today she reports symptoms have resolved Use of muscle relaxant was not beneficial.  Outpatient Medications Prior to Visit  Medication Sig   [place] FE 1.5/30 1.5-30 MG-MCG tablet Take 1 tablet by mouth daily.   methocarbamol (ROBAXIN) 500 MG tablet Take 1 tablet (500 mg total) by mouth 2 (two) times daily.   No facility-administered medications prior to visit.   Reviewed past medical and social history.  Review of Systems Per HPI     Objective:    Physical Exam Vitals reviewed.  Musculoskeletal:     Right knee: Normal.     Left knee: Normal.     Right lower leg: Normal. No edema.     Left lower leg: Normal. No edema.     Right ankle: Normal.     Right Achilles Tendon: Normal.     Left ankle: Normal.     Left Achilles Tendon: Normal.  Skin:    General: Skin is warm and dry.     Findings: No erythema or rash.  Neurological:     Mental Status: She is alert and oriented to person, place, and time.    BP 120/80 (BP Location: Right Arm, Patient Position: Sitting, Cuff Size: Normal)   Pulse 76   Temp (!) 97.3 F (36.3 C) (Temporal)   Ht 5\' 3"  (1.6 m)   Wt 246 lb 3.2 oz (111.7 kg)   SpO2 98%   BMI 43.61 kg/m    No results found for any visits on 02/08/22.     Assessment & Plan:   Problem List Items Addressed This Visit   None Visit Diagnoses     Pain of left calf    -  Primary     Start daily calf stretch, provided printed copy Use ibuprofen 600mg   with food at least once a say x 3-7days.  No orders of the defined types were placed in this encounter.  Return if symptoms worsen or fail to improve.    02/10/22, NP

## 2022-02-08 NOTE — Patient Instructions (Signed)
Start daily calf stretch Use ibuprofen 600mg  with food at least once a say x 3-7days.   Gastrocnemius Rehab Ask your health care provider which exercises are safe for you. Do exercises exactly as told by your health care provider and adjust them as directed. It is normal to feel mild stretching, pulling, tightness, or discomfort as you do these exercises. Stop right away if you feel sudden pain or your pain gets worse. Do not begin these exercises until told by your health care provider. Stretching and range-of-motion exercises These exercises warm up your muscles and joints and improve the movement and flexibility of your lower leg. These exercises also help to relieve pain and stiffness. Gastrocnemius stretch This exercise is also called a calf stretch. It stretches the muscles in the back of the lower leg (gastrocnemius). Sit with your left / right leg extended. Loop a belt or towel around the ball of your left / right foot. The ball of your foot is on the walking surface, right under your toes. Hold both ends of the belt or towel. Keep your left / right ankle and foot relaxed and keep your knee straight while you use the belt or towel to pull your foot and ankle toward you. Stop at the first point of resistance. Hold this position for __________ seconds. Repeat __________ times. Complete this exercise __________ times a day. Ankle alphabet  Sit with your left / right leg supported at the lower leg. Do not rest your foot on anything. Make sure your foot has room to move freely. Think of your left / right foot as a paintbrush. Move your foot to trace each letter of the alphabet in the air. Keep your hip and knee still while you trace. Make the letters as large as you can without feeling discomfort. Trace every letter of the alphabet. Repeat __________ times. Complete this exercise __________ times a day. Strengthening exercises These exercises build strength and endurance in your lower  leg. Endurance is the ability to use your muscles for a long time, even after they get tired. Plantar flexion with band, seated  Sit on the floor with your left / right leg extended. Loop a rubber exercise band or tube around the ball of your left / right foot. The ball of your foot is on the walking surface, right under your toes. The band or tube should be slightly tense when your foot is relaxed. If the band or tube slips, you can put on your shoe or put a washcloth between the band and your foot to help it stay in place. While holding both ends of the band or tube, slowly point your toes downward, pushing them away from you (plantar flexion). Hold this position for __________ seconds. Slowly release the tension in the band or tube, controlling smoothly until your foot is back to the starting position. Repeat __________ times. Complete this exercise __________ times a day. Plantar flexion, standing  Stand with your feet shoulder-width apart. Place your hands on a wall or table to steady yourself as needed, but try not to use it for support. Rise up on your toes (plantar flexion). If this exercise is too easy, try these options: Shift your weight toward your left / right leg until you feel challenged. If told by your health care provider, stand on your left / right foot only. Hold this position for __________ seconds. Repeat __________ times. Complete this exercise __________ times a day. Eccentric plantar flexion  Stand on the balls  of your feet on the edge of a step. The ball of your foot is on the walking surface, right under your toes. Do not put your heels on the step. For balance, rest your hands on the wall or on a railing. Try not to lean on it for support. Rise up onto the balls of your feet, using both legs to help. Keeping your heels up, slowly shift all of your weight to your left / right foot and lift your other foot off the step. Slowly lower your left / right heel so it  drops below the level of the step. Lowering your heel under tension is called eccentric plantar flexion. You will feel a slight stretch in your left / right calf. Put your other foot back onto the step before returning to the start position. Repeat __________ times. Complete this exercise __________ times a day. This information is not intended to replace advice given to you by your health care provider. Make sure you discuss any questions you have with your health care provider. Document Revised: 03/02/2021 Document Reviewed: 03/02/2021 Elsevier Patient Education  2023 [employer].

## 2022-12-05 DIAGNOSIS — N6310 Unspecified lump in the right breast, unspecified quadrant: Secondary | ICD-10-CM | POA: Diagnosis not present

## 2022-12-09 DIAGNOSIS — R928 Other abnormal and inconclusive findings on diagnostic imaging of breast: Secondary | ICD-10-CM | POA: Diagnosis not present

## 2022-12-09 DIAGNOSIS — N6341 Unspecified lump in right breast, subareolar: Secondary | ICD-10-CM | POA: Diagnosis not present

## 2022-12-09 DIAGNOSIS — Z803 Family history of malignant neoplasm of breast: Secondary | ICD-10-CM | POA: Diagnosis not present

## 2022-12-30 DIAGNOSIS — Z114 Encounter for screening for human immunodeficiency virus [HIV]: Secondary | ICD-10-CM | POA: Diagnosis not present

## 2022-12-30 DIAGNOSIS — Z118 Encounter for screening for other infectious and parasitic diseases: Secondary | ICD-10-CM | POA: Diagnosis not present

## 2022-12-30 DIAGNOSIS — Z124 Encounter for screening for malignant neoplasm of cervix: Secondary | ICD-10-CM | POA: Diagnosis not present

## 2022-12-30 DIAGNOSIS — Z1159 Encounter for screening for other viral diseases: Secondary | ICD-10-CM | POA: Diagnosis not present

## 2022-12-30 DIAGNOSIS — Z01419 Encounter for gynecological examination (general) (routine) without abnormal findings: Secondary | ICD-10-CM | POA: Diagnosis not present

## 2022-12-30 DIAGNOSIS — Z113 Encounter for screening for infections with a predominantly sexual mode of transmission: Secondary | ICD-10-CM | POA: Diagnosis not present

## 2023-01-05 LAB — HM PAP SMEAR: HM Pap smear: NEGATIVE

## 2023-03-02 ENCOUNTER — Encounter: Payer: Self-pay | Admitting: Nurse Practitioner

## 2023-03-02 ENCOUNTER — Ambulatory Visit (INDEPENDENT_AMBULATORY_CARE_PROVIDER_SITE_OTHER): Payer: BC Managed Care – PPO | Admitting: Nurse Practitioner

## 2023-03-02 VITALS — BP 120/84 | HR 80 | Temp 98.4°F | Resp 16 | Ht 63.0 in | Wt 270.0 lb

## 2023-03-02 DIAGNOSIS — Z1283 Encounter for screening for malignant neoplasm of skin: Secondary | ICD-10-CM | POA: Diagnosis not present

## 2023-03-02 DIAGNOSIS — Z0000 Encounter for general adult medical examination without abnormal findings: Secondary | ICD-10-CM | POA: Diagnosis not present

## 2023-03-02 DIAGNOSIS — Z87442 Personal history of urinary calculi: Secondary | ICD-10-CM | POA: Insufficient documentation

## 2023-03-02 LAB — CBC
HCT: 39.6 % (ref 36.0–46.0)
Hemoglobin: 13 g/dL (ref 12.0–15.0)
MCHC: 32.9 g/dL (ref 30.0–36.0)
MCV: 94.7 fl (ref 78.0–100.0)
Platelets: 285 10*3/uL (ref 150.0–400.0)
RBC: 4.18 Mil/uL (ref 3.87–5.11)
RDW: 14.1 % (ref 11.5–15.5)
WBC: 8.6 10*3/uL (ref 4.0–10.5)

## 2023-03-02 LAB — COMPREHENSIVE METABOLIC PANEL
ALT: 19 U/L (ref 0–35)
AST: 15 U/L (ref 0–37)
Albumin: 4.3 g/dL (ref 3.5–5.2)
Alkaline Phosphatase: 65 U/L (ref 39–117)
BUN: 9 mg/dL (ref 6–23)
CO2: 27 mEq/L (ref 19–32)
Calcium: 9.3 mg/dL (ref 8.4–10.5)
Chloride: 102 mEq/L (ref 96–112)
Creatinine, Ser: 0.54 mg/dL (ref 0.40–1.20)
GFR: 127.51 mL/min (ref >60.00)
Glucose, Bld: 84 mg/dL (ref 70–99)
Potassium: 4 mEq/L (ref 3.5–5.1)
Sodium: 136 mEq/L (ref 135–145)
Total Bilirubin: 0.9 mg/dL (ref 0.2–1.2)
Total Protein: 7.2 g/dL (ref 6.0–8.3)

## 2023-03-02 NOTE — Progress Notes (Signed)
Stable Follow instructions as discussed during office visit.

## 2023-03-02 NOTE — Patient Instructions (Signed)
Go to lab maintain Heart healthy diet and daily exercise.  Preventive Care 26-26 Years Old, Female Preventive care refers to lifestyle choices and visits with your health care provider that can promote health and wellness. Preventive care visits are also called wellness exams. What can I expect for my preventive care visit? Counseling During your preventive care visit, your health care provider may ask about your: Medical history, including: Past medical problems. Family medical history. Pregnancy history. Current health, including: Menstrual cycle. Method of birth control. Emotional well-being. Home life and relationship well-being. Sexual activity and sexual health. Lifestyle, including: Alcohol, nicotine or tobacco, and drug use. Access to firearms. Diet, exercise, and sleep habits. Work and work environment. Sunscreen use. Safety issues such as seatbelt and bike helmet use. Physical exam Your health care provider may check your: Height and weight. These may be used to calculate your BMI (body mass index). BMI is a measurement that tells if you are at a healthy weight. Waist circumference. This measures the distance around your waistline. This measurement also tells if you are at a healthy weight and may help predict your risk of certain diseases, such as type 2 diabetes and high blood pressure. Heart rate and blood pressure. Body temperature. Skin for abnormal spots. What immunizations do I need?  Vaccines are usually given at various ages, according to a schedule. Your health care provider will recommend vaccines for you based on your age, medical history, and lifestyle or other factors, such as travel or where you work. What tests do I need? Screening Your health care provider may recommend screening tests for certain conditions. This may include: Pelvic exam and Pap test. Lipid and cholesterol levels. Diabetes screening. This is done by checking your blood sugar  (glucose) after you have not eaten for a while (fasting). Hepatitis B test. Hepatitis C test. HIV (human immunodeficiency virus) test. STI (sexually transmitted infection) testing, if you are at risk. BRCA-related cancer screening. This may be done if you have a family history of breast, ovarian, tubal, or peritoneal cancers. Talk with your health care provider about your test results, treatment options, and if necessary, the need for more tests. Follow these instructions at home: Eating and drinking  Eat a healthy diet that includes fresh fruits and vegetables, whole grains, lean protein, and low-fat dairy products. Take vitamin and mineral supplements as recommended by your health care provider. Do not drink alcohol if: Your health care provider tells you not to drink. You are pregnant, may be pregnant, or are planning to become pregnant. If you drink alcohol: Limit how much you have to 0-1 drink a day. Know how much alcohol is in your drink. In the [place] one drink equals one 12 oz bottle of beer (355 mL), one 5 oz glass of wine (148 mL), or one 1 oz glass of hard liquor (44 mL). Lifestyle Brush your teeth every morning and night with fluoride toothpaste. Floss one time each day. Exercise for at least 30 minutes 5 or more days each week. Do not use any products that contain nicotine or tobacco. These products include cigarettes, chewing tobacco, and vaping devices, such as e-cigarettes. If you need help quitting, ask your health care provider. Do not use drugs. If you are sexually active, practice safe sex. Use a condom or other form of protection to prevent STIs. If you do not wish to become pregnant, use a form of birth control. If you plan to become pregnant, see your health care provider for a   prepregnancy visit. Find healthy ways to manage stress, such as: Meditation, yoga, or listening to music. Journaling. Talking to a trusted person. Spending time with friends and  family. Minimize exposure to UV radiation to reduce your risk of skin cancer. Safety Always wear your seat belt while driving or riding in a vehicle. Do not drive: If you have been drinking alcohol. Do not ride with someone who has been drinking. If you have been using any mind-altering substances or drugs. While texting. When you are tired or distracted. Wear a helmet and other protective equipment during sports activities. If you have firearms in your house, make sure you follow all gun safety procedures. Seek help if you have been physically or sexually abused. What's next? Go to your health care provider once a year for an annual wellness visit. Ask your health care provider how often you should have your eyes and teeth checked. Stay up to date on all vaccines. This information is not intended to replace advice given to you by your health care provider. Make sure you discuss any questions you have with your health care provider. Document Revised: 03/03/2021 Document Reviewed: 03/03/2021 Elsevier Patient Education  2024 Elsevier Inc.  

## 2023-03-02 NOTE — Progress Notes (Signed)
Complete physical exam  Patient: Katrina Knight   DOB: [birth year]   26 y.o. Female  MRN: [id number] Visit Date: 03/02/2023  Subjective:    Chief Complaint  Patient presents with   Annual Exam   Sherica Frometa is a 27 y.o. female who presents today for a complete physical exam. She reports consuming a weight watchers diet.  Walking periodically  She generally feels well. She reports sleeping well. She does not have additional problems to discuss today.  Vision:Yes Dental:Yes STD Screen:No  Appointment with GYN 12/2022, normal PAP per patient, report requested.  BP Readings from Last 3 Encounters:  03/02/23 120/84  02/08/22 120/80  02/01/22 116/78   Wt Readings from Last 3 Encounters:  03/02/23 270 lb (122.5 kg)  02/08/22 246 lb 3.2 oz (111.7 kg)  02/01/22 243 lb (110.2 kg)   Most recent fall risk assessment:    03/02/2023    1:18 PM  Fall Risk   Falls in the past year? 0  Number falls in past yr: 0  Injury with Fall? 0  Risk for fall due to : No Fall Risks  Follow up Falls evaluation completed   Depression screen:Yes - No Depression  Most recent depression screenings:    03/02/2023    2:14 PM 01/18/2022   11:22 AM  PHQ 2/9 Scores  PHQ - 2 Score 3 0  PHQ- 9 Score 10 3   HPI  No problem-specific Assessment & Plan notes found for this encounter.  Past Medical History:  Diagnosis Date   Allergy    Seasonal allergies, dust mites   Anxiety    Depression    Migraines    Past Surgical History:  Procedure Laterality Date   KIDNEY STONE SURGERY     2017 and 2018   Social History   Socioeconomic History   Marital status: Single    Spouse name: Not on file   Number of children: 0   Years of education: Not on file   Highest education level: Not on file  Occupational History   Not on file  Tobacco Use   Smoking status: Never   Smokeless tobacco: Never  Vaping Use   Vaping Use: Never used  Substance and Sexual Activity   Alcohol use: Yes    Alcohol/week:  2.0 - 4.0 standard drinks of alcohol    Types: 1 - 2 Glasses of wine, 1 - 2 Cans of beer per week    Comment: This is not reflective of every week.   Drug use: Never   Sexual activity: Yes    Birth control/protection: Pill  Other Topics Concern   Not on file  Social History Narrative   Not on file   Social Determinants of Health   Financial Resource Strain: Not on file  Food Insecurity: Not on file  Transportation Needs: Not on file  Physical Activity: Not on file  Stress: Not on file  Social Connections: Not on file  Intimate Partner Violence: Not on file   Family Status  Relation Name Status   Mother Mother Alive   Father  Alive   MGF Grandfather (Not Specified)   MGM Grandmother (Not Specified)   Sister Sister (Not Specified)   Family History  Problem Relation Age of Onset   Cancer Mother        Breast cancer, negative BRCA gene   Hypertension Mother    Migraines Mother    Sleep apnea Mother    Alcohol abuse Maternal Grandfather  Depression Maternal Grandmother    ADD / ADHD Sister    No Known Allergies  Patient Care Team: Monzerrat Wellen, Bonna Gains, NP as PCP - General (Internal Medicine) Amado Nash Candace Gallus, MD as Consulting Physician (Obstetrics and Gynecology)   Medications: Outpatient Medications Prior to Visit  Medication Sig   AUROVELA FE 1.5/30 1.5-30 MG-MCG tablet Take 1 tablet by mouth daily.   [DISCONTINUED] methocarbamol (ROBAXIN) 500 MG tablet Take 1 tablet (500 mg total) by mouth 2 (two) times daily.   No facility-administered medications prior to visit.   Review of Systems  Constitutional:  Negative for activity change, appetite change and unexpected weight change.  Respiratory: Negative.    Cardiovascular: Negative.   Gastrointestinal: Negative.   Endocrine: Negative for cold intolerance and heat intolerance.  Genitourinary: Negative.   Musculoskeletal: Negative.   Skin: Negative.   Neurological: Negative.   Hematological: Negative.    Psychiatric/Behavioral:  Negative for behavioral problems, decreased concentration, dysphoric mood, hallucinations, self-injury, sleep disturbance and suicidal ideas. The patient is not nervous/anxious.         Objective:  BP 120/84 (BP Location: Left Arm, Patient Position: Sitting, Cuff Size: Large)   Pulse 80   Temp 98.4 F (36.9 C) (Temporal)   Resp 16   Ht 5\' 3"  (1.6 m)   Wt 270 lb (122.5 kg)   LMP 02/01/2023 (Approximate)   SpO2 97%   BMI 47.83 kg/m     Physical Exam Vitals and nursing note reviewed.  Constitutional:      General: She is not in acute distress. HENT:     Right Ear: Tympanic membrane, ear canal and external ear normal.     Left Ear: Tympanic membrane, ear canal and external ear normal.     Nose: Nose normal.  Eyes:     Extraocular Movements: Extraocular movements intact.     Conjunctiva/sclera: Conjunctivae normal.     Pupils: Pupils are equal, round, and reactive to light.  Neck:     Thyroid: No thyroid mass, thyromegaly or thyroid tenderness.  Cardiovascular:     Rate and Rhythm: Normal rate and regular rhythm.     Pulses: Normal pulses.     Heart sounds: Normal heart sounds.  Pulmonary:     Effort: Pulmonary effort is normal.     Breath sounds: Normal breath sounds.  Abdominal:     General: Bowel sounds are normal.     Palpations: Abdomen is soft.  Musculoskeletal:        General: Normal range of motion.     Cervical back: Normal range of motion and neck supple.     Right lower leg: No edema.     Left lower leg: No edema.  Lymphadenopathy:     Cervical: No cervical adenopathy.  Skin:    General: Skin is warm and dry.  Neurological:     Mental Status: She is alert and oriented to person, place, and time.     Cranial Nerves: No cranial nerve deficit.  Psychiatric:        Mood and Affect: Mood normal.        Behavior: Behavior normal.        Thought Content: Thought content normal.      No results found for any visits on 03/02/23.     Assessment & Plan:    Routine Health Maintenance and Physical Exam  Immunization History  Administered Date(s) Administered   HPV 9-valent 03/21/2011, 05/26/2011, 09/22/2011   Influenza,inj,Quad PF,6+ Mos 07/17/2020   Influenza,inj,Quad  PF,6-35 Mos 09/22/2011   Influenza-Unspecified 09/22/2011   Meningococcal B, OMV 02/22/2018, 03/26/2018   Meningococcal Conjugate 03/05/2015   PFIZER(Purple Top)SARS-COV-2 Vaccination 11/27/2019, 12/18/2019, 08/26/2020   Tdap 02/22/2018   Health Maintenance  Topic Date Due   PAP-Cervical Cytology Screening  Never done   PAP SMEAR-Modifier  Never done   COVID-19 Vaccine (4 - 2023-24 season) 05/20/2022   Hepatitis C Screening  03/01/2024 (Originally 03/29/2015)   HIV Screening  03/01/2024 (Originally 03/28/2012)   INFLUENZA VACCINE  04/20/2023   DTaP/Tdap/Td (2 - Td or Tdap) 02/23/2028   HPV VACCINES  Completed   Discussed health benefits of physical activity, and encouraged her to engage in regular exercise appropriate for her age and condition.  Problem List Items Addressed This Visit   None Visit Diagnoses     Preventative health care    -  Primary   Relevant Orders   CBC   Comprehensive metabolic panel   Skin cancer screening       Relevant Orders   Ambulatory referral to Dermatology      Return in about 1 year (around 03/01/2024) for CPE (fasting).     Alysia Penna, NP

## 2023-04-06 IMAGING — US US EXTREM LOW VENOUS*L*
1 series · 13 of 24 positions shown · non-contrast
Comparison: None Available.

CLINICAL DATA: Left leg pain and swelling



[Series 1: us extrem low venous*left* · 13 of 29 slices shown]
[im 1/29]
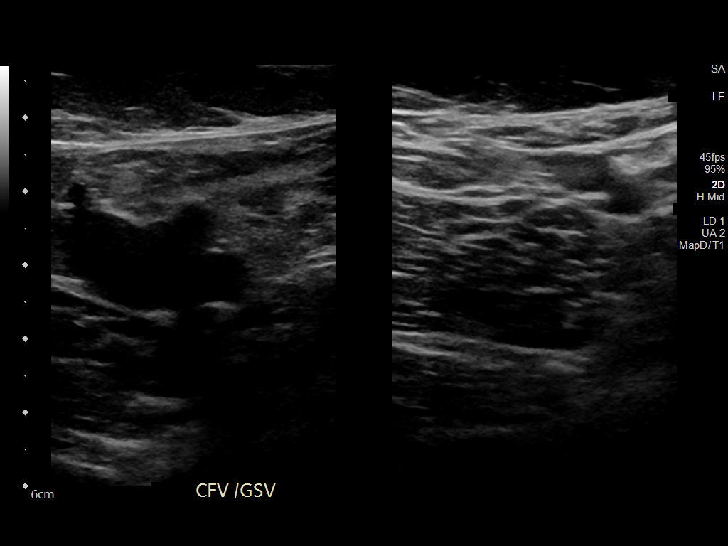
[im 3/29]
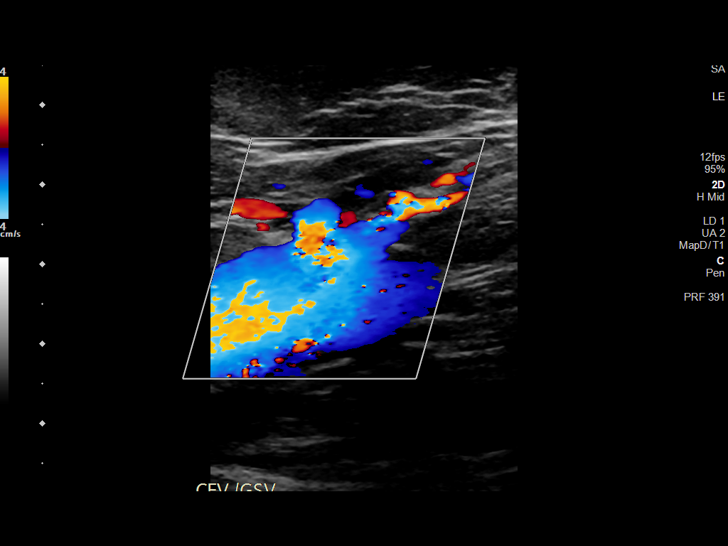
[im 5/29]
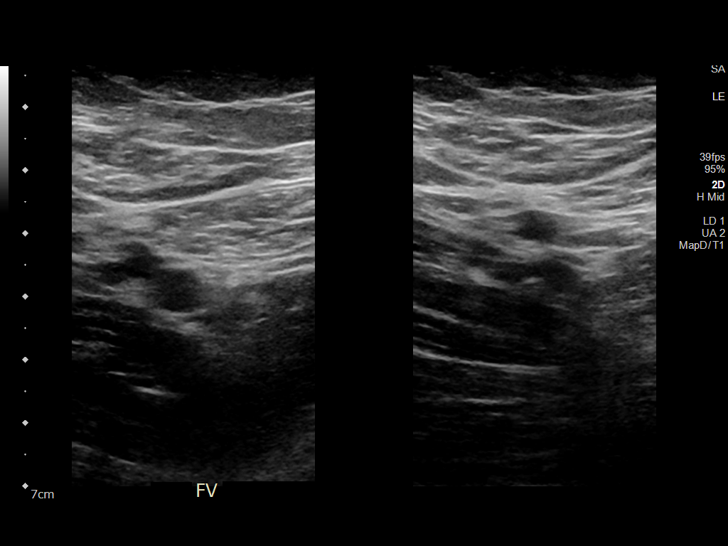
[im 8/29]
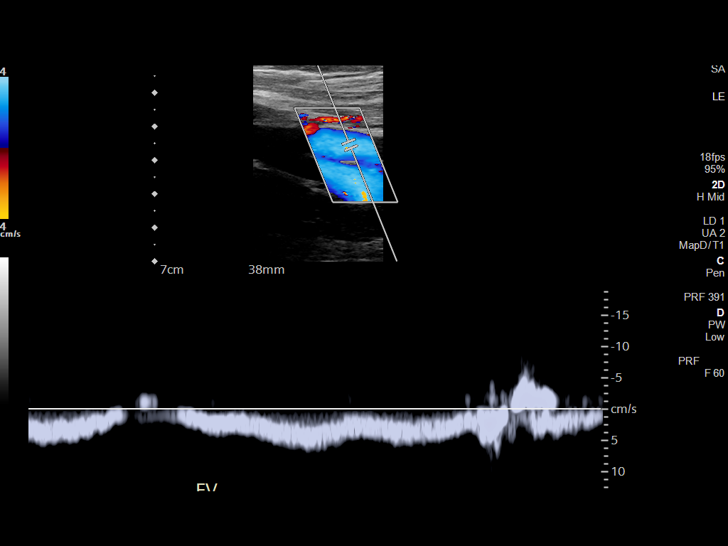
[im 10/29]
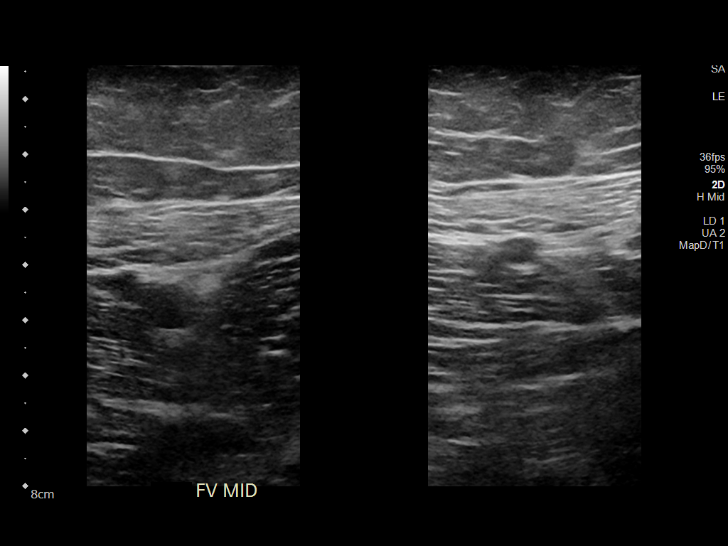
[im 13/29]
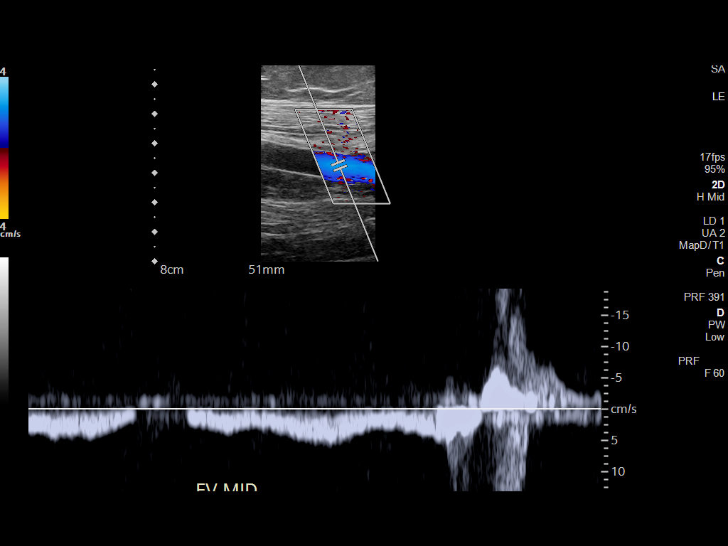
[im 15/29]
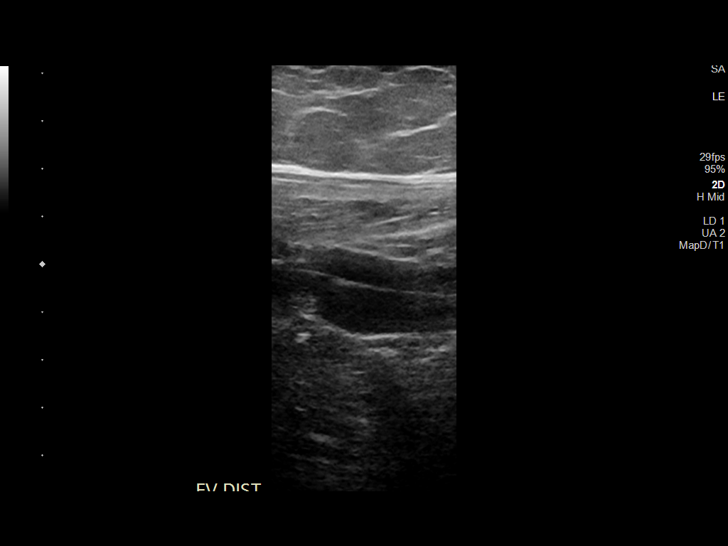
[im 16/29]
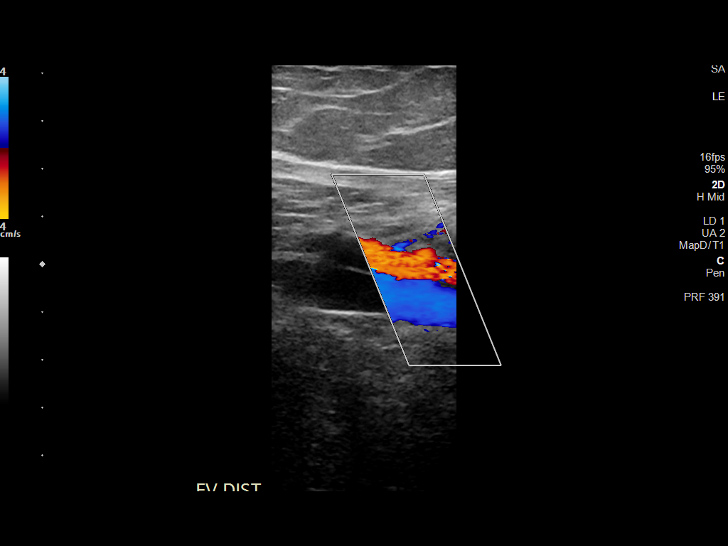
[im 19/29]
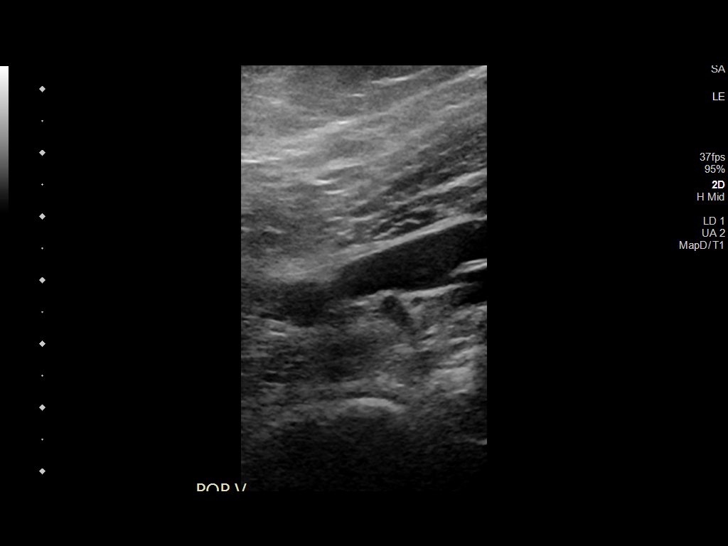
[im 21/29]
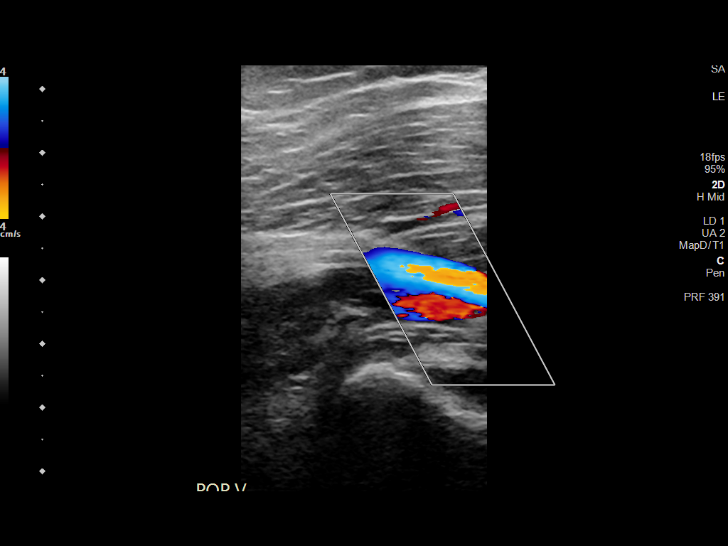
[im 24/29]
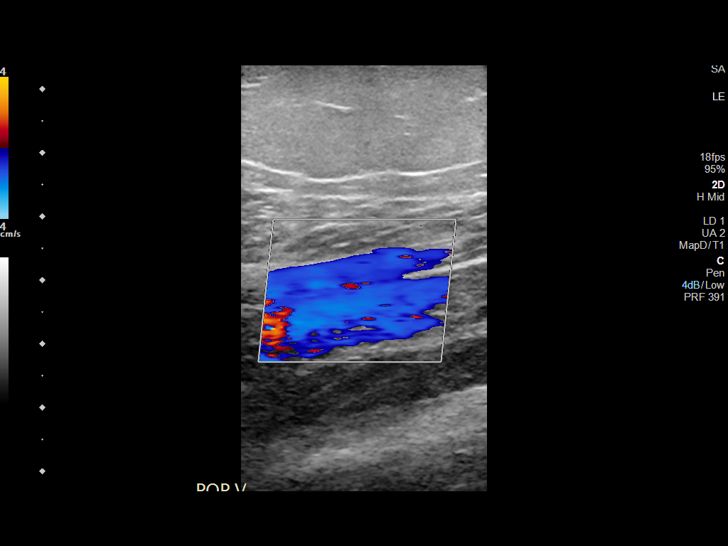
[im 26/29]
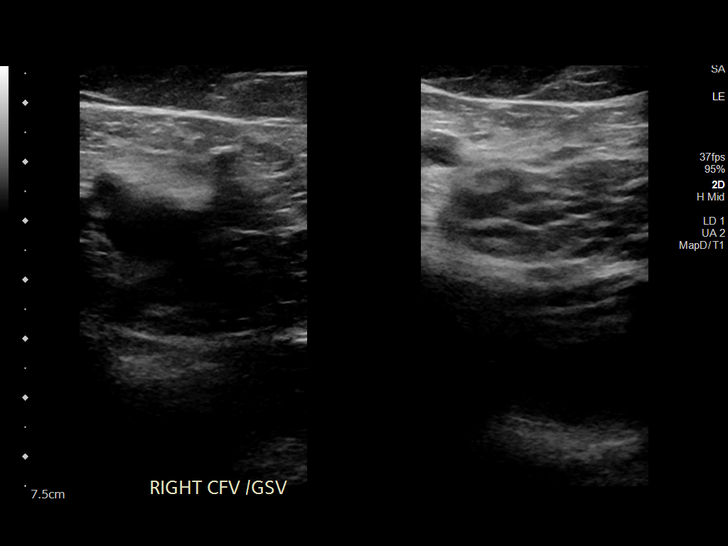
[im 29/29]
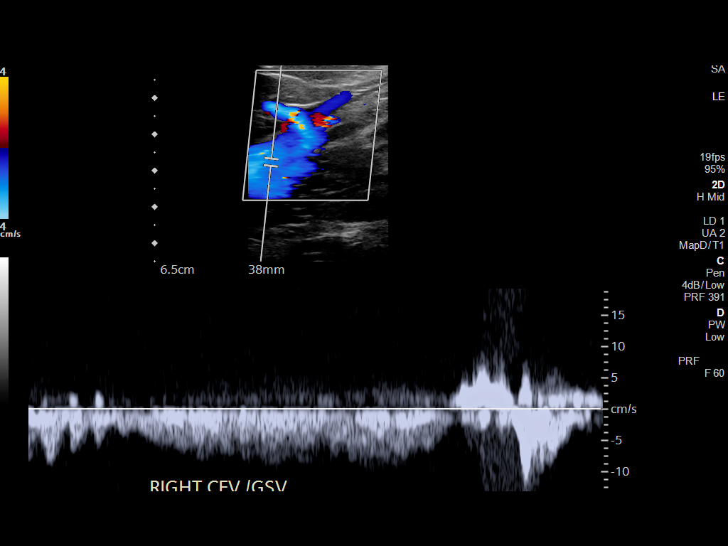

[13 of 24 positions shown; findings below may reference images not displayed]

FINDINGS: Contralateral Common Femoral Vein: Respiratory phasicity is normal
and symmetric with the symptomatic side. No evidence of thrombus.
Normal compressibility.

Common Femoral Vein: No evidence of thrombus. Normal
compressibility, respiratory phasicity and response to augmentation.

Saphenofemoral Junction: No evidence of thrombus. Normal
compressibility and flow on color Doppler imaging.

Profunda Femoral Vein: No evidence of thrombus. Normal
compressibility and flow on color Doppler imaging.

Femoral Vein: No evidence of thrombus. Normal compressibility,
respiratory phasicity and response to augmentation.

Popliteal Vein: No evidence of thrombus. Normal compressibility,
respiratory phasicity and response to augmentation.

Calf Veins: No evidence of thrombus. Normal compressibility and flow
on color Doppler imaging.

Superficial Great Saphenous Vein: No evidence of thrombus. Normal
compressibility.

Venous Reflux:  None.

Other Findings:  None.
IMPRESSION: No evidence of deep venous thrombosis.

## 2023-09-04 ENCOUNTER — Ambulatory Visit: Payer: BC Managed Care – PPO | Admitting: Dermatology

## 2023-09-04 ENCOUNTER — Encounter: Payer: Self-pay | Admitting: Dermatology

## 2023-09-04 VITALS — BP 152/86 | HR 95

## 2023-09-04 DIAGNOSIS — W908XXA Exposure to other nonionizing radiation, initial encounter: Secondary | ICD-10-CM | POA: Diagnosis not present

## 2023-09-04 DIAGNOSIS — Z1283 Encounter for screening for malignant neoplasm of skin: Secondary | ICD-10-CM

## 2023-09-04 DIAGNOSIS — D229 Melanocytic nevi, unspecified: Secondary | ICD-10-CM

## 2023-09-04 DIAGNOSIS — L578 Other skin changes due to chronic exposure to nonionizing radiation: Secondary | ICD-10-CM

## 2023-09-04 DIAGNOSIS — L821 Other seborrheic keratosis: Secondary | ICD-10-CM

## 2023-09-04 DIAGNOSIS — Z808 Family history of malignant neoplasm of other organs or systems: Secondary | ICD-10-CM

## 2023-09-04 DIAGNOSIS — L814 Other melanin hyperpigmentation: Secondary | ICD-10-CM | POA: Diagnosis not present

## 2023-09-04 DIAGNOSIS — L816 Other disorders of diminished melanin formation: Secondary | ICD-10-CM

## 2023-09-04 DIAGNOSIS — D1801 Hemangioma of skin and subcutaneous tissue: Secondary | ICD-10-CM

## 2023-09-04 NOTE — Progress Notes (Signed)
   New Patient Visit   Subjective  Katrina Knight is a 27 y.o. female who presents for the following: Skin Cancer Screening and Full Body Skin Exam. Pt has no hx of skin cancer. Family hx of SCC.  The patient presents for Total-Body Skin Exam (TBSE) for skin cancer screening and mole check. The patient has spots, moles and lesions to be evaluated, some may be new or changing.  The following portions of the chart were reviewed this encounter and updated as appropriate: medications, allergies, medical history  Review of Systems:  No other skin or systemic complaints except as noted in HPI or Assessment and Plan.  Objective  Well appearing patient in no apparent distress; mood and affect are within normal limits.  A full examination was performed including scalp, head, eyes, ears, nose, lips, neck, chest, axillae, abdomen, back, buttocks, bilateral upper extremities, bilateral lower extremities, hands, feet, fingers, toes, fingernails, and toenails. All findings within normal limits unless otherwise noted below.   Relevant physical exam findings are noted in the Assessment and Plan.    Assessment & Plan   SKIN CANCER SCREENING PERFORMED TODAY.  ACTINIC DAMAGE - Chronic condition, secondary to cumulative UV/sun exposure - diffuse scaly erythematous macules with underlying dyspigmentation - Recommend daily broad spectrum sunscreen SPF 30+ to sun-exposed areas, reapply every 2 hours as needed.  - Staying in the shade or wearing long sleeves, sun glasses (UVA+UVB protection) and wide brim hats (4-inch brim around the entire circumference of the hat) are also recommended for sun protection.  - Call for new or changing lesions.  LENTIGINES, SEBORRHEIC KERATOSES, HEMANGIOMAS - Benign normal skin lesions - Benign-appearing - Call for any changes  MELANOCYTIC NEVI - Tan-brown and/or pink-flesh-colored symmetric macules and papules - Benign appearing on exam today - Observation - Call  clinic for new or changing moles - Recommend daily use of broad spectrum spf 30+ sunscreen to sun-exposed areas.   Congenital Melanocytic Nevus- Mid Back - Monthly self-skin checks to monitor for any changes in moles are recommended. - Congenital Nevus are pigmented nests of cells within the skin. No treatment is necessary. Contact Office if: Any moles change in size, shape or color; itch, burn or bleed.  IDIOPATHIC GUTTATE HYPOMELANOSIS- Right shoulder - Discussed sun damage - Recommend SPF 30+ daily   Return in about 2 years (around 09/03/2025) for TBSC.  Dominga Ferry, Surg Tech III, am acting as scribe for Gwenith Daily, MD.   Documentation: I have reviewed the above documentation for accuracy and completeness, and I agree with the above.  Gwenith Daily, MD

## 2023-09-04 NOTE — Patient Instructions (Signed)
 Important Information  Due to recent changes in healthcare laws, you may see results of your pathology and/or laboratory studies on MyChart before the doctors have had a chance to review them. We understand that in some cases there may be results that are confusing or concerning to you. Please understand that not all results are received at the same time and often the doctors may need to interpret multiple results in order to provide you with the best plan of care or course of treatment. Therefore, we ask that you please give [place] 2 business days to thoroughly review all your results before contacting the office for clarification. Should we see a critical lab result, you will be contacted sooner.   If You Need Anything After Your Visit  If you have any questions or concerns for your doctor, please call our main line at [phone] If no one answers, please leave a voicemail as directed and we will return your call as soon as possible. Messages left after 4 pm will be answered the following business day.   You may also send [place] a message via MyChart. We typically respond to MyChart messages within 1-2 business days.  For prescription refills, please ask your pharmacy to contact our office. Our fax number is [phone].  If you have an urgent issue when the clinic is closed that cannot wait until the next business day, you can page your doctor at the number below.    Please note that while we do our best to be available for urgent issues outside of office hours, we are not available 24/7.   If you have an urgent issue and are unable to reach [place], you may choose to seek medical care at your doctor's office, retail clinic, urgent care center, or emergency room.  If you have a medical emergency, please immediately call 911 or go to the emergency department. In the event of inclement weather, please call our main line at [phone] for an update on the status of any delays or  closures.  Dermatology Medication Tips: Please keep the boxes that topical medications come in in order to help keep track of the instructions about where and how to use these. Pharmacies typically print the medication instructions only on the boxes and not directly on the medication tubes.   If your medication is too expensive, please contact our office at [phone] or send [place] a message through MyChart.   We are unable to tell what your co-pay for medications will be in advance as this is different depending on your insurance coverage. However, we may be able to find a substitute medication at lower cost or fill out paperwork to get insurance to cover a needed medication.   If a prior authorization is required to get your medication covered by your insurance company, please allow [place] 1-2 business days to complete this process.  Drug prices often vary depending on where the prescription is filled and some pharmacies may offer cheaper prices.  The website www.goodrx.com contains coupons for medications through different pharmacies. The prices here do not account for what the cost may be with help from insurance (it may be cheaper with your insurance), but the website can give you the price if you did not use any insurance.  - You can print the associated coupon and take it with your prescription to the pharmacy.  - You may also stop by our office during regular business hours and pick up a GoodRx coupon card.  - If  you need your prescription sent electronically to a different pharmacy, notify our office through [facility] or by phone at [phone]    Skin Education :   I counseled the patient regarding the following: Sun screen (SPF 30 or greater) should be applied during peak UV exposure (between 10am and 2pm) and reapplied after exercise or swimming.  The ABCDEs of melanoma were reviewed with the patient, and the importance of monthly self-examination of moles was emphasized.  Should any moles change in shape or color, or itch, bleed or burn, pt will contact our office for evaluation sooner then their interval appointment.  Plan: Sunscreen Recommendations I recommended a broad spectrum sunscreen with a SPF of 30 or higher. I explained that SPF 30 sunscreens block approximately 97 percent of the sun's harmful rays. Sunscreens should be applied at least 15 minutes prior to expected sun exposure and then every 2 hours after that as long as sun exposure continues. If swimming or exercising sunscreen should be reapplied every 45 minutes to an hour after getting wet or sweating. One ounce, or the equivalent of a shot glass full of sunscreen, is adequate to protect the skin not covered by a bathing suit. I also recommended a lip balm with a sunscreen as well. Sun protective clothing can be used in lieu of sunscreen but must be worn the entire time you are exposed to the sun's rays.

## 2023-09-14 ENCOUNTER — Telehealth: Payer: Self-pay

## 2023-09-14 NOTE — Transitions of Care (Post Inpatient/ED Visit) (Signed)
   09/14/2023  Name: Keesha Cattaneo MRN: [id number] DOB: [birth year]  Today's TOC FU Call Status: Today's TOC FU Call Status:: Successful TOC FU Call Completed TOC FU Call Complete Date: 09/14/23 Patient's Name and Date of Birth confirmed.  Transition Care Management Follow-up Telephone Call Date of Discharge: 09/12/23 Discharge Facility: Other [facility]) Name of Other (Non-[facility]) Discharge Facility: [facility] ED Type of Discharge: Emergency Department How have you been since you were released from the hospital?: Better Any questions or concerns?: No  Items Reviewed: Did you receive and understand the discharge instructions provided?: Yes Any new allergies since your discharge?: No Dietary orders reviewed?: NA Do you have support at home?: Yes  Medications Reviewed Today: Medications Reviewed Today   Medications were not reviewed in this encounter     Home Care and Equipment/Supplies: Were Home Health Services Ordered?: NA Any new equipment or medical supplies ordered?: NA  Functional Questionnaire: Do you need assistance with bathing/showering or dressing?: No Do you need assistance with meal preparation?: No Do you need assistance with eating?: No Do you have difficulty maintaining continence: No Do you need assistance with getting out of bed/getting out of a chair/moving?: No Do you have difficulty managing or taking your medications?: No  Follow up appointments reviewed: PCP Follow-up appointment confirmed?: NA Specialist Hospital Follow-up appointment confirmed?: NA Do you need transportation to your follow-up appointment?: No Do you understand care options if your condition(s) worsen?: Yes-patient verbalized understanding   Derenda Fennel, RMA
